# Patient Record
Sex: Female | Born: 1995 | Race: Black or African American | Hispanic: No | Marital: Single | Smoking: Never smoker
Health system: Southern US, Community
[De-identification: ages and names within clinical notes are randomized; demographics above are authoritative.]

## PROBLEM LIST (undated history)

## (undated) DIAGNOSIS — N83209 Unspecified ovarian cyst, unspecified side: Secondary | ICD-10-CM

## (undated) DIAGNOSIS — Z789 Other specified health status: Secondary | ICD-10-CM

## (undated) HISTORY — DX: Other specified health status: Z78.9

## (undated) HISTORY — DX: Unspecified ovarian cyst, unspecified side: N83.209

## (undated) HISTORY — PX: WISDOM TOOTH EXTRACTION: SHX21

## (undated) HISTORY — PX: SPINE SURGERY: SHX786

---

## 2021-02-23 ENCOUNTER — Encounter: Payer: Self-pay | Admitting: Physician Assistant

## 2021-02-23 ENCOUNTER — Emergency Department
Admission: EM | Admit: 2021-02-23 | Discharge: 2021-02-23 | Disposition: A | Discharge status: Home / Self Care | Payer: Medicaid Other | Attending: Emergency Medicine | Admitting: Emergency Medicine

## 2021-02-23 ENCOUNTER — Other Ambulatory Visit: Payer: Self-pay

## 2021-02-23 DIAGNOSIS — Z3201 Encounter for pregnancy test, result positive: Secondary | ICD-10-CM | POA: Diagnosis not present

## 2021-02-23 LAB — POC URINE PREG, ED: Preg Test, Ur: POSITIVE — AB

## 2021-02-23 MED ORDER — PRENATAL PLUS 27-1 MG PO TABS: 1.0000 | ORAL_TABLET | Freq: Every day | ORAL | 2 refills | Status: AC

## 2021-02-23 NOTE — ED Provider Notes (Addendum)
[facility] Emergency Department Provider Note ____________________________________________  Time seen: 0906  I have reviewed the triage vital signs and the nursing notes.  HISTORY  Chief Complaint  Possible Pregnancy   HPI Tiffany Booth is a 25 y.o. female patient who reports her last menstrual period was 5/18, presents to the ED for evaluation of a home pregnancy test that was positive today.  Patient presented to the ED for combination of the test.  It appears she was somewhat in denial about the possibility of being pregnant.  History reviewed. No pertinent past medical history.  There are no problems to display for this patient.   History reviewed. No pertinent surgical history.  Prior to Admission medications   Medication Sig Start Date End Date Taking? Authorizing Provider  prenatal vitamin w/FE, FA (PRENATAL 1 + 1) 27-1 MG TABS tablet Take 1 tablet by mouth daily at 12 noon. 02/23/21 05/24/21 Yes Audryna Wendt, Charlesetta Ivory, PA-C    Allergies Patient has no known allergies.  History reviewed. No pertinent family history.  Social History    Review of Systems  Constitutional: Negative for fever. Eyes: Negative for visual changes. ENT: Negative for sore throat. Cardiovascular: Negative for chest pain. Respiratory: Negative for shortness of breath. Gastrointestinal: Negative for abdominal pain, vomiting and diarrhea. Genitourinary: Negative for dysuria.  Amenorrhea as above. Musculoskeletal: Negative for back pain. Skin: Negative for rash. Neurological: Negative for headaches, focal weakness or numbness. ____________________________________________  PHYSICAL EXAM:  VITAL SIGNS: ED Triage Vitals  Enc Vitals Group     BP 02/23/21 0837 118/81     Pulse Rate 02/23/21 0837 (!) 102     Resp 02/23/21 0837 16     Temp 02/23/21 0837 98.4 F (36.9 C)     Temp Source 02/23/21 0837 Oral     SpO2 02/23/21 0837 100 %     Weight 02/23/21 0835 216  lb (98 kg)     Height 02/23/21 0835 5\' 6"  (1.676 m)     Head Circumference --      Peak Flow --      Pain Score 02/23/21 0835 0     Pain Loc --      Pain Edu? --      Excl. in GC? --     Constitutional: Alert and oriented. Well appearing and in no distress. Head: Normocephalic and atraumatic. Eyes: Conjunctivae are normal. Normal extraocular movements Cardiovascular: Normal rate, regular rhythm. Normal distal pulses. Respiratory: Normal respiratory effort.  Musculoskeletal: Nontender with normal range of motion in all extremities.  Neurologic:  Normal gait without ataxia. Normal speech and language. No gross focal neurologic deficits are appreciated. Skin:  Skin is warm, dry and intact. No rash noted. Psychiatric: Mood and affect are normal. Patient exhibits appropriate insight and judgment. ____________________________________________    LABS (pertinent positives/negatives) Labs Reviewed  POC URINE PREG, ED - Abnormal; Notable for the following components:      Result Value   Preg Test, Ur POSITIVE (*)    All other components within normal limits  ____________________________________________  EKG  ____________________________________________   RADIOLOGY Official radiology report(s): No results found. ____________________________________________  PROCEDURES   Procedures ____________________________________________   INITIAL IMPRESSION / ASSESSMENT AND PLAN / ED COURSE  As part of my medical decision making, I reviewed the following data within the electronic MEDICAL RECORD NUMBER Labs reviewed as noted and Notes from prior ED visits     Patient with ED evaluation and request for repeat pregnancy test after  she had a home positive pregnancy test.  Patient's urine pregnancy in the ED is positive, and she is tearful about the results.  She endorses that she intends to see the pregnancy through, but is disappointed because she is set to graduate in April.  Patient is advised  that her EDD is 07/09/2022.  She is approximately 11 weeks and 5 days gestation at this time.  She has no complaints at this time of any pelvic pain, bleeding, discharge.  She was referred to a local OB provider for initial prenatal care.    Tiffany Booth was evaluated in Emergency Department on 02/23/2021 for the symptoms described in the history of present illness. She was evaluated in the context of the global COVID-19 pandemic, which necessitated consideration that the patient might be at risk for infection with the SARS-CoV-2 virus that causes COVID-19. Institutional protocols and algorithms that pertain to the evaluation of patients at risk for COVID-19 are in a state of rapid change based on information released by regulatory bodies including the CDC and federal and state organizations. These policies and algorithms were followed during the patient's care in the ED.  ____________________________________________  FINAL CLINICAL IMPRESSION(S) / ED DIAGNOSES  Final diagnoses:  Pregnancy test positive      Karmen Stabs, Charlesetta Ivory, PA-C 02/23/21 1703    Lissa Hoard, PA-C 02/23/21 1707    Delton Prairie, MD 02/24/21 1517

## 2021-02-23 NOTE — ED Triage Notes (Signed)
Pt here requesting a pregnancy test to confirm a postitve pregnancy test she took at home. Pt A/Ox 4.

## 2021-02-23 NOTE — Discharge Instructions (Signed)
We have confirmed your pregnancy. You are 11 weeks and 5 days pregnant. You have an expected delivery date of September 09, 2021. Start the prenatal vitamins and establish care with the health department or a local OB provider.

## 2021-03-14 DIAGNOSIS — O99512 Diseases of the respiratory system complicating pregnancy, second trimester: Secondary | ICD-10-CM | POA: Diagnosis not present

## 2021-03-14 DIAGNOSIS — J029 Acute pharyngitis, unspecified: Secondary | ICD-10-CM | POA: Diagnosis not present

## 2021-03-14 DIAGNOSIS — Z3A16 16 weeks gestation of pregnancy: Secondary | ICD-10-CM | POA: Diagnosis not present

## 2021-03-14 DIAGNOSIS — Z3A15 15 weeks gestation of pregnancy: Secondary | ICD-10-CM | POA: Diagnosis not present

## 2021-03-14 DIAGNOSIS — Z20822 Contact with and (suspected) exposure to covid-19: Secondary | ICD-10-CM | POA: Diagnosis not present

## 2021-03-14 DIAGNOSIS — R059 Cough, unspecified: Secondary | ICD-10-CM | POA: Diagnosis not present

## 2021-03-24 DIAGNOSIS — Z419 Encounter for procedure for purposes other than remedying health state, unspecified: Secondary | ICD-10-CM | POA: Diagnosis not present

## 2021-04-04 ENCOUNTER — Ambulatory Visit (INDEPENDENT_AMBULATORY_CARE_PROVIDER_SITE_OTHER): Payer: Medicaid Other | Admitting: Advanced Practice Midwife

## 2021-04-04 ENCOUNTER — Encounter: Payer: Self-pay | Admitting: Advanced Practice Midwife

## 2021-04-04 ENCOUNTER — Other Ambulatory Visit (HOSPITAL_COMMUNITY)
Admission: RE | Admit: 2021-04-04 | Discharge: 2021-04-04 | Disposition: A | Discharge status: Home / Self Care | Payer: Medicaid Other | Source: Ambulatory Visit | Attending: Advanced Practice Midwife | Admitting: Advanced Practice Midwife

## 2021-04-04 ENCOUNTER — Other Ambulatory Visit: Payer: Self-pay

## 2021-04-04 VITALS — BP 100/60 | Wt 222.0 lb

## 2021-04-04 DIAGNOSIS — Z131 Encounter for screening for diabetes mellitus: Secondary | ICD-10-CM

## 2021-04-04 DIAGNOSIS — Z3490 Encounter for supervision of normal pregnancy, unspecified, unspecified trimester: Secondary | ICD-10-CM | POA: Insufficient documentation

## 2021-04-04 DIAGNOSIS — O99212 Obesity complicating pregnancy, second trimester: Secondary | ICD-10-CM

## 2021-04-04 DIAGNOSIS — Z369 Encounter for antenatal screening, unspecified: Secondary | ICD-10-CM

## 2021-04-04 DIAGNOSIS — Z130 Encounter for screening for diseases of the blood and blood-forming organs and certain disorders involving the immune mechanism: Secondary | ICD-10-CM

## 2021-04-04 DIAGNOSIS — Z3A13 13 weeks gestation of pregnancy: Secondary | ICD-10-CM

## 2021-04-04 DIAGNOSIS — O99210 Obesity complicating pregnancy, unspecified trimester: Secondary | ICD-10-CM | POA: Insufficient documentation

## 2021-04-04 DIAGNOSIS — Z113 Encounter for screening for infections with a predominantly sexual mode of transmission: Secondary | ICD-10-CM

## 2021-04-04 DIAGNOSIS — Z124 Encounter for screening for malignant neoplasm of cervix: Secondary | ICD-10-CM

## 2021-04-04 DIAGNOSIS — Z3402 Encounter for supervision of normal first pregnancy, second trimester: Secondary | ICD-10-CM

## 2021-04-04 DIAGNOSIS — Z1159 Encounter for screening for other viral diseases: Secondary | ICD-10-CM

## 2021-04-04 NOTE — Patient Instructions (Signed)

## 2021-04-05 ENCOUNTER — Other Ambulatory Visit: Payer: Self-pay

## 2021-04-05 ENCOUNTER — Ambulatory Visit (INDEPENDENT_AMBULATORY_CARE_PROVIDER_SITE_OTHER): Payer: Medicaid Other | Admitting: Internal Medicine

## 2021-04-05 ENCOUNTER — Encounter: Payer: Self-pay | Admitting: Internal Medicine

## 2021-04-05 VITALS — BP 105/70 | HR 83 | Temp 98.4°F | Ht 66.42 in | Wt 222.6 lb

## 2021-04-05 DIAGNOSIS — M79604 Pain in right leg: Secondary | ICD-10-CM | POA: Diagnosis not present

## 2021-04-05 DIAGNOSIS — M79661 Pain in right lower leg: Secondary | ICD-10-CM | POA: Diagnosis not present

## 2021-04-05 DIAGNOSIS — M79662 Pain in left lower leg: Secondary | ICD-10-CM | POA: Diagnosis not present

## 2021-04-05 DIAGNOSIS — M79605 Pain in left leg: Secondary | ICD-10-CM

## 2021-04-05 DIAGNOSIS — Z0000 Encounter for general adult medical examination without abnormal findings: Secondary | ICD-10-CM

## 2021-04-05 LAB — URINALYSIS, ROUTINE W REFLEX MICROSCOPIC
Bilirubin, UA: NEGATIVE
Glucose, UA: NEGATIVE
Ketones, UA: NEGATIVE
Leukocytes,UA: NEGATIVE
Nitrite, UA: NEGATIVE
Protein,UA: NEGATIVE
RBC, UA: NEGATIVE
Specific Gravity, UA: >1.03 — ABNORMAL HIGH (ref 1.005–1.030)
Urobilinogen, Ur: 0.2 mg/dL (ref 0.2–1.0)
pH, UA: 6.5 (ref 5.0–7.5)
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
## 2021-04-06 ENCOUNTER — Encounter: Payer: Self-pay | Admitting: Advanced Practice Midwife

## 2021-04-06 LAB — COMPREHENSIVE METABOLIC PANEL
ALT: 10 IU/L (ref 0–32)
AST: 13 IU/L (ref 0–40)
Albumin/Globulin Ratio: 1.7 (ref 1.2–2.2)
Albumin: 4.2 g/dL (ref 3.9–5.0)
Alkaline Phosphatase: 64 IU/L (ref 44–121)
BUN/Creatinine Ratio: 9 (ref 9–23)
BUN: 5 mg/dL — ABNORMAL LOW (ref 6–20)
Bilirubin Total: 0.3 mg/dL (ref 0.0–1.2)
CO2: 21 mmol/L (ref 20–29)
Calcium: 9.5 mg/dL (ref 8.7–10.2)
Chloride: 102 mmol/L (ref 96–106)
Creatinine, Ser: 0.58 mg/dL (ref 0.57–1.00)
Globulin, Total: 2.5 g/dL (ref 1.5–4.5)
Glucose: 71 mg/dL (ref 65–99)
Potassium: 4 mmol/L (ref 3.5–5.2)
Sodium: 136 mmol/L (ref 134–144)
Total Protein: 6.7 g/dL (ref 6.0–8.5)
eGFR: 131 mL/min/{1.73_m2} (ref >59)

## 2021-04-06 LAB — CBC WITH DIFFERENTIAL/PLATELET
Basophils Absolute: 0 x10E3/uL (ref 0.0–0.2)
Basos: 1 %
EOS (ABSOLUTE): 0.2 x10E3/uL (ref 0.0–0.4)
Eos: 2 %
Hematocrit: 36.4 % (ref 34.0–46.6)
Hemoglobin: 12 g/dL (ref 11.1–15.9)
Immature Grans (Abs): 0 x10E3/uL (ref 0.0–0.1)
Immature Granulocytes: 0 %
Lymphocytes Absolute: 1.9 x10E3/uL (ref 0.7–3.1)
Lymphs: 23 %
MCH: 27.5 pg (ref 26.6–33.0)
MCHC: 33 g/dL (ref 31.5–35.7)
MCV: 83 fL (ref 79–97)
Monocytes Absolute: 0.7 x10E3/uL (ref 0.1–0.9)
Monocytes: 8 %
Neutrophils Absolute: 5.7 x10E3/uL (ref 1.4–7.0)
Neutrophils: 66 %
Platelets: 251 x10E3/uL (ref 150–450)
RBC: 4.37 x10E6/uL (ref 3.77–5.28)
RDW: 13 % (ref 11.7–15.4)
WBC: 8.6 x10E3/uL (ref 3.4–10.8)

## 2021-04-06 LAB — RPR+RH+ABO+RUB AB+AB SCR+CB...
Antibody Screen: NEGATIVE
HIV Screen 4th Generation wRfx: NONREACTIVE
Hematocrit: 36.3 % (ref 34.0–46.6)
Hemoglobin: 12.1 g/dL (ref 11.1–15.9)
Hepatitis B Surface Ag: NEGATIVE
MCH: 27.6 pg (ref 26.6–33.0)
MCHC: 33.3 g/dL (ref 31.5–35.7)
MCV: 83 fL (ref 79–97)
Platelets: 245 x10E3/uL (ref 150–450)
RBC: 4.38 x10E6/uL (ref 3.77–5.28)
RDW: 13.1 % (ref 11.7–15.4)
RPR Ser Ql: NONREACTIVE
Rh Factor: POSITIVE
Rubella Antibodies, IGG: 6.39 {index}
Varicella zoster IgG: 1564 {index}
WBC: 8.7 x10E3/uL (ref 3.4–10.8)

## 2021-04-06 LAB — HGB FRACTIONATION CASCADE
Hgb A2: 3.3 % — ABNORMAL HIGH (ref 1.8–3.2)
Hgb A: 96.7 % (ref 96.4–98.8)
Hgb F: 0 % (ref 0.0–2.0)
Hgb S: 0 %

## 2021-04-06 LAB — LIPID PANEL
Chol/HDL Ratio: 2 ratio (ref 0.0–4.4)
Cholesterol, Total: 188 mg/dL (ref 100–199)
HDL: 96 mg/dL (ref >39)
LDL Chol Calc (NIH): 77 mg/dL (ref 0–99)
Triglycerides: 82 mg/dL (ref 0–149)
VLDL Cholesterol Cal: 15 mg/dL (ref 5–40)

## 2021-04-06 LAB — TSH: TSH: 1.49 u[IU]/mL (ref 0.450–4.500)

## 2021-04-06 LAB — HGB A1C W/O EAG: Hgb A1c MFr Bld: 5.1 % (ref 4.8–5.6)

## 2021-04-06 LAB — HEPATITIS C ANTIBODY: Hep C Virus Ab: <0.1 {s_co_ratio} (ref 0.0–0.9)

## 2021-04-06 NOTE — Progress Notes (Signed)
New Obstetric Patient H&P  Date of Service: 04/04/2021  Chief Complaint: "Desires prenatal care"   History of Present Illness: Patient is a 26 y.o. G1P0 Not Hispanic or Latino female, presents with amenorrhea and positive home pregnancy test. Patient's last menstrual period was 12/03/2020 (exact date). and based on her  LMP, her EDD is Estimated Date of Delivery: 09/09/21 and her EGA is [redacted]w[redacted]d. Cycles are 5 days, regular, and occur approximately every : 28 days. She is possibly only 13 weeks based on fetal heart tones location by doppler. Her first PAP smear is today.    She had a urine pregnancy test which was positive 5 week(s)  ago. Her last menstrual period was normal and lasted for  5 day(s). Since her LMP she claims she has experienced breast tenderness, fatigue, nausea, vomiting. She denies vaginal bleeding. Her past medical history is noncontributory.   Since her LMP, she admits to the use of tobacco products: she quit vaping She claims she has gained  6  pounds since the start of her pregnancy.  There are cats in the home in the home  no  She admits close contact with children on a regular basis  no  She has had chicken pox in the past no She has had Tuberculosis exposures, symptoms, or previously tested positive for TB   no Current or past history of domestic violence. no  Genetic Screening/Teratology Counseling: (Includes patient, baby's father, or anyone in either family with:)   1. Patient's age >/= 61 at [facility]  no 2. Thalassemia ([place], [place], Mediterranean, or Asian background): MCV<80  no 3. Neural tube defect (meningomyelocele, spina bifida, anencephaly)  no 4. Congenital heart defect  no  5. Down syndrome  Her uncle has DS 6. Tay-Sachs (Jewish, [place]  no 7. Canavan's Disease  no 8. Sickle cell disease or trait (African)  no  9. Hemophilia or other blood disorders  no  10. Muscular dystrophy  no  11. Cystic fibrosis  no  12. Huntington's Chorea  no  13.  Mental retardation/autism  Her brother has MR 14. Other inherited genetic or chromosomal disorder  no 15. Maternal metabolic disorder (DM, PKU, etc)  no 16. Patient or FOB with a child with a birth defect not listed above no  16a. Patient or FOB with a birth defect themselves no 17. Recurrent pregnancy loss, or stillbirth  no  18. Any medications since LMP other than prenatal vitamins (include vitamins, supplements, OTC meds, drugs, alcohol)  no 19. 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 She is encouraged to use protection and avoid exposure as much as possible.  Infection History:   1. Lives with someone with TB or TB exposed  no  2. Patient or partner has history of genital herpes  no 3. Rash or viral illness since LMP  no 4. History of STI (GC, CT, HPV, syphilis, HIV)  no 5. History of recent travel :  no  Other pertinent information:  no     Review of Systems:10 point review of systems negative unless otherwise noted in HPI  Past Medical History:  Patient Active Problem List   Diagnosis Date Noted   Supervision of normal pregnancy 04/04/2021     Nursing Staff Provider  Office Location  [facility] Dating    Language  English Anatomy US    Flu Vaccine   Genetic Screen  NIPS:   TDaP vaccine  Hgb A1C or  GTT Early : Third trimester :   Covid    LAB RESULTS   Rhogam   Blood Type B/Positive/-- (09/12 1109)   Feeding Plan Breast Antibody Negative (09/12 1109)  Contraception  Rubella 6.39 (09/12 1109)  Circumcision  RPR Non Reactive (09/12 1109)   Pediatrician   HBsAg Negative (09/12 1109)   Support Person Partner Tiffany Booth Her mom Tiffany Booth HIV Non Reactive (09/12 1109)  Prenatal Classes  Varicella     GBS  (For PCN allergy, check sensitivities)   BTL Consent     VBAC Consent NA Pap  1st PAP @ [facility] 04/04/21    Hgb Electro    Pelvis Tested NA CF      SMA            Obesity affecting pregnancy  04/04/2021    Past Surgical History:  Past Surgical History:  Procedure Laterality Date   SPINE SURGERY      Gynecologic History: Patient's last menstrual period was 12/03/2020 (exact date).  Obstetric History: G1P0  Family History:  Family History  Problem Relation Age of Onset   Hypertension Mother    Breast cancer Maternal Grandmother 50    Social History:  Social History   Socioeconomic History   Marital status: Single    Spouse name: Not on file   Number of children: Not on file   Years of education: Not on file   Highest education level: Not on file  Occupational History   Not on file  Tobacco Use   Smoking status: Never   Smokeless tobacco: Never  Vaping Use   Vaping Use: Former  Substance and Sexual Activity   Alcohol use: Never   Drug use: Never   Sexual activity: Yes  Other Topics Concern   Not on file  Social History Narrative   Not on file   Social Determinants of Health   Financial Resource Strain: Not on file  Food Insecurity: Not on file  Transportation Needs: Not on file  Physical Activity: Not on file  Stress: Not on file  Social Connections: Not on file  Intimate Partner Violence: Not on file    Allergies:  No Known Allergies  Medications: Prior to Admission medications   Medication Sig Start Date End Date Taking? Authorizing Provider  prenatal vitamin w/FE, FA (PRENATAL 1 + 1) 27-1 MG TABS tablet Take 1 tablet by mouth daily at 12 noon. 02/23/21 05/24/21 Yes Tiffany Booth, Tiffany Ivory, PA-C    Physical Exam Vitals: Blood pressure 100/60, weight 222 lb (100.7 kg), last menstrual period 12/03/2020.  General: NAD HEENT: normocephalic, anicteric Thyroid: no enlargement, no palpable nodules Pulmonary: No increased work of breathing, CTAB Cardiovascular: RRR, distal pulses 2+ Abdomen: NABS, soft, non-tender, non-distended.  Umbilicus without lesions.  No hepatomegaly, splenomegaly or masses palpable. No evidence of hernia. FHTs by  doppler 160s at approximately 13w gestation location  Genitourinary:  External: Normal external female genitalia.  Normal urethral meatus, normal  Bartholin's and Skene's glands.    Vagina: Normal vaginal mucosa, no evidence of prolapse.    Cervix: Grossly normal in appearance, no bleeding, no CMT  Uterus:  Non-enlarged, mobile, normal contour.    Adnexa: ovaries non-enlarged, no adnexal masses  Rectal: deferred Extremities: no edema, erythema, or tenderness Neurologic: Grossly intact Psychiatric: mood appropriate, affect full   The following were addressed during this visit:  Breastfeeding Education - Early initiation of breastfeeding    Comments: Keeps milk supply adequate, helps contract uterus and  slow bleeding, and early milk is the perfect first food and is easy to digest.   - The importance of exclusive breastfeeding    Comments: Provides antibodies, Lower risk of breast and ovarian cancers, and type-2 diabetes,Helps your body recover, Reduced chance of SIDS.   - Risks of giving your baby anything other than breast milk if you are breastfeeding    Comments: Make the baby less content with breastfeeds, may make my baby more susceptible to illness, and may reduce my milk supply.   - The importance of early skin-to-skin contact    Comments:  Keeps baby warm and secure, helps keep baby's blood sugar up and breathing steady, easier to bond and breastfeed, and helps calm baby.  - Rooming-in on a 24-hour basis    Comments: Easier to learn baby's feeding cues, easier to bond and get to know each other, and encourages milk production.   - Feeding on demand or baby-led feeding    Comments: Helps prevent breastfeeding complications, helps bring in good milk supply, prevents under or overfeeding, and helps baby feel content and satisfied   - Frequent feeding to help assure optimal milk production    Comments: Making a full supply of milk requires frequent removal of milk from  breasts, infant will eat 8-12 times in 24 hours, if separated from infant use breast massage, hand expression and/ or pumping to remove milk from breasts.   - Effective positioning and attachment    Comments: Helps my baby to get enough breast milk, helps to produce an adequate milk supply, and helps prevent nipple pain and damage   - Exclusive breastfeeding for the first 6 months    Comments: Builds a healthy milk supply and keeps it up, protects baby from sickness and disease, and breastmilk has everything your baby needs for the first 6 months.   Assessment: 25 y.o. G1P0 at [redacted]w[redacted]d by LMP presenting to initiate prenatal care  Plan: 1) Avoid alcoholic beverages. 2) Patient encouraged not to smoke.  3) Discontinue the use of all non-medicinal drugs and chemicals.  4) Take prenatal vitamins daily.  5) Nutrition, food safety (fish, cheese advisories, and high nitrite foods) and exercise discussed. 6) Hospital and practice style discussed with cross coverage system.  7) Genetic Screening, such as with 1st Trimester Screening, cell free fetal DNA, AFP testing, and Ultrasound, as well as with amniocentesis and CVS as appropriate, is discussed with patient. At the conclusion of today's visit patient requested genetic testing 8) Patient is asked about travel to areas at risk for the Zika virus, and counseled to avoid travel and exposure to mosquitoes or sexual partners who may have themselves been exposed to the virus. Testing is discussed, and will be ordered as appropriate. 9) PAPtima, urine culture, [facility] panel, sickle cell screen, Hep C screen, Hgb A1C, MaterniT 21 today 10) Return to clinic in 1 week for dating scan and ROB   Tresea Mall, CNM [facility] 04/06/2021, 12:56 PM

## 2021-04-07 LAB — URINE CULTURE: Organism ID, Bacteria: NO GROWTH

## 2021-04-07 LAB — CYTOLOGY - PAP: Diagnosis: NEGATIVE

## 2021-04-09 LAB — MATERNIT21 PLUS CORE+SCA
Fetal Fraction: 6
Monosomy X (Turner Syndrome): NOT DETECTED
Result (T21): NEGATIVE
Trisomy 13 (Patau syndrome): NEGATIVE
Trisomy 18 (Edwards syndrome): NEGATIVE
Trisomy 21 (Down syndrome): NEGATIVE
XXX (Triple X Syndrome): NOT DETECTED
XXY (Klinefelter Syndrome): NOT DETECTED
XYY (Jacobs Syndrome): NOT DETECTED

## 2021-04-12 ENCOUNTER — Other Ambulatory Visit: Payer: Self-pay

## 2021-04-12 ENCOUNTER — Ambulatory Visit (INDEPENDENT_AMBULATORY_CARE_PROVIDER_SITE_OTHER): Payer: Medicaid Other | Admitting: Obstetrics & Gynecology

## 2021-04-12 VITALS — BP 120/80 | Wt 223.0 lb

## 2021-04-12 DIAGNOSIS — Z369 Encounter for antenatal screening, unspecified: Secondary | ICD-10-CM

## 2021-04-12 DIAGNOSIS — Z3402 Encounter for supervision of normal first pregnancy, second trimester: Secondary | ICD-10-CM

## 2021-04-12 DIAGNOSIS — N926 Irregular menstruation, unspecified: Secondary | ICD-10-CM | POA: Diagnosis not present

## 2021-04-12 DIAGNOSIS — Z3689 Encounter for other specified antenatal screening: Secondary | ICD-10-CM

## 2021-04-12 NOTE — Progress Notes (Signed)
  Subjective  No pain or nausea  Objective  BP 120/80   Wt 223 lb (101.2 kg)   LMP 12/03/2020 (Exact Date)   BMI 35.54 kg/m  General: NAD Pumonary: no increased work of breathing Abdomen: gravid, non-tender Extremities: no edema Psychiatric: mood appropriate, affect full  Assessment  25 y.o. G1P0 at [redacted]w[redacted]d by  09/09/2021, by Last Menstrual Period presenting for routine prenatal visit  Plan   Problem List Items Addressed This Visit   None Visit Diagnoses    Irregular menstrual cycle    -  Primary    PNV [place] today, see report Will sch Anat scan Labs UTD NIPT discussed  Pregnancy #1 Problems (from 04/04/21 to present)    Problem Noted Resolved   Supervision of normal pregnancy 04/04/2021 by Tresea Mall, CNM No   Overview Addendum 04/06/2021  1:07 PM by Tresea Mall, CNM     Nursing Staff Provider  Office Location  [facility] Dating  LMP and [place] (18 wks)  Language  English Anatomy US    Flu Vaccine   Genetic Screen  NIPS: nml XY  TDaP vaccine    Hgb A1C or  GTT Third trimester :   Covid    LAB RESULTS   Rhogam   Blood Type B/Positive/-- (09/12 1109)   Feeding Plan Breast Antibody Negative (09/12 1109)  Contraception  Rubella 6.39 (09/12 1109)  Circumcision  RPR Non Reactive (09/12 1109)   Pediatrician   HBsAg Negative (09/12 1109)   Support Person Partner [name] Her mom Vickie HIV Non Reactive (09/12 1109)  Prenatal Classes  Varicella     GBS  (For PCN allergy, check sensitivities)   BTL Consent     VBAC Consent NA Pap  1st PAP @ [facility] 04/04/21    Hgb Electro    Pelvis Tested NA CF      SMA               Obesity affecting pregnancy 04/04/2021 by Tresea Mall, CNM No       Annamarie Major, MD, [facility], [facility] 04/12/2021  2:21 PM

## 2021-04-12 NOTE — Patient Instructions (Signed)

## 2021-04-12 NOTE — Progress Notes (Signed)
ULTRASOUND REPORT  Location: [facility] Date of Service: 04/12/2021   Indications: Irregular periods, unclear LMP Findings:  Mason Jim intrauterine pregnancy is visualized with FHR at 150 BPM.   Biometrics give an (U/S) Gestational age of [redacted]w[redacted]d and an (U/S) EDD of 09/09/21; this correlates with the clinically established Estimated Date of Delivery: 09/09/21   Fetal presentation is Variable. Good fetal movements Placenta: posterior. Grade: 1 AFI: subjectively normal.   Right Ovary: is normal in appearance. Left Ovary: is normal appearance. Survey of the adnexa demonstrates no adnexal masses.  There is no free peritoneal fluid in the cul de sac.  Impression: 1. [redacted]w[redacted]d Viable Singleton Intrauterine pregnancy by U/S. 2. (U/S) EDD is consistent with Clinically established Estimated Date of Delivery: 09/09/21 .  Recommendations: 1.Clinical correlation with the patient's History and Physical Exam. 2. EDC will be 09/09/2021  Letitia Libra, MD

## 2021-04-18 ENCOUNTER — Telehealth: Payer: Self-pay

## 2021-04-18 ENCOUNTER — Other Ambulatory Visit: Payer: Self-pay

## 2021-04-18 ENCOUNTER — Ambulatory Visit
Admission: RE | Admit: 2021-04-18 | Discharge: 2021-04-18 | Disposition: A | Discharge status: Home / Self Care | Payer: Medicaid Other | Source: Ambulatory Visit | Attending: Obstetrics & Gynecology | Admitting: Obstetrics & Gynecology

## 2021-04-18 DIAGNOSIS — Z3689 Encounter for other specified antenatal screening: Secondary | ICD-10-CM | POA: Diagnosis not present

## 2021-04-18 DIAGNOSIS — Z3492 Encounter for supervision of normal pregnancy, unspecified, second trimester: Secondary | ICD-10-CM | POA: Diagnosis not present

## 2021-04-18 DIAGNOSIS — Z3A19 19 weeks gestation of pregnancy: Secondary | ICD-10-CM | POA: Diagnosis not present

## 2021-04-18 NOTE — Telephone Encounter (Signed)
Pt calling for nausea medication.  [phone]  Adv pt to try to keep something on her stomach if only a saltine cracker, pretzels, bread, dry toast; vitamin B6 10-24mg  q8hrs; unisom 25mg  at HS, 12.5mg  in am; nausea suckers, wrist bands, ginger drops, sip ginger ale.  If goes 24hrs without keeping liquids down she needs to be seen.

## 2021-04-19 DIAGNOSIS — M5430 Sciatica, unspecified side: Secondary | ICD-10-CM | POA: Diagnosis not present

## 2021-04-20 ENCOUNTER — Ambulatory Visit (INDEPENDENT_AMBULATORY_CARE_PROVIDER_SITE_OTHER): Payer: Medicaid Other | Admitting: Advanced Practice Midwife

## 2021-04-20 ENCOUNTER — Other Ambulatory Visit: Payer: Self-pay

## 2021-04-20 ENCOUNTER — Encounter: Payer: Self-pay | Admitting: Advanced Practice Midwife

## 2021-04-20 ENCOUNTER — Other Ambulatory Visit: Payer: Self-pay | Admitting: Advanced Practice Midwife

## 2021-04-20 VITALS — BP 120/80 | Wt 224.0 lb

## 2021-04-20 DIAGNOSIS — O219 Vomiting of pregnancy, unspecified: Secondary | ICD-10-CM

## 2021-04-20 DIAGNOSIS — Z3402 Encounter for supervision of normal first pregnancy, second trimester: Secondary | ICD-10-CM

## 2021-04-20 DIAGNOSIS — Z369 Encounter for antenatal screening, unspecified: Secondary | ICD-10-CM

## 2021-04-20 DIAGNOSIS — Z3A19 19 weeks gestation of pregnancy: Secondary | ICD-10-CM

## 2021-04-20 MED ORDER — ONDANSETRON 4 MG PO TBDP: 4.0000 mg | ORAL_TABLET | Freq: Three times a day (TID) | ORAL | 2 refills | Status: DC | PRN

## 2021-04-20 NOTE — Progress Notes (Signed)
Routine Prenatal Care Visit  Subjective  Tiffany Booth is a 25 y.o. G1P0 at [redacted]w[redacted]d being seen today for ongoing prenatal care.  She is currently monitored for the following issues for this low-risk pregnancy and has Supervision of normal pregnancy and Obesity affecting pregnancy on their problem list.  ----------------------------------------------------------------------------------- Patient reports nausea. Her nausea returned in the past week. She is requesting medication to treat.   . Vag. Bleeding: None.  Movement: Absent. Leaking Fluid denies.  ----------------------------------------------------------------------------------- The following portions of the patient's history were reviewed and updated as appropriate: allergies, current medications, past family history, past medical history, past social history, past surgical history and problem list. Problem list updated.  Objective  Blood pressure 120/80, weight 224 lb (101.6 kg), last menstrual period 12/03/2020. Pregravid weight 216 lb (98 kg) Total Weight Gain 8 lb (3.629 kg) Urinalysis: Urine Protein    Urine Glucose    Fetal Status: Fetal Heart Rate (bpm): 142 Fundal Height: 20 cm Movement: Absent      Anatomy scan: Incomplete for cardiac views, echogenic focus seen left ventricle, RVOT, LVOT, bilateral renal pyelectasis seen- 5 mm left, 6 mm right, otherwise complete and normal, placenta posterior. Recommendation from U/S MD for follow up with MFM.  General:  Alert, oriented and cooperative. Patient is in no acute distress.  Skin: Skin is warm and dry. No rash noted.   Cardiovascular: Normal heart rate noted  Respiratory: Normal respiratory effort, no problems with respiration noted  Abdomen: Soft, gravid, appropriate for gestational age. Pain/Pressure: Absent     Pelvic:  Cervical exam deferred        Extremities: Normal range of motion.     Mental Status: Normal mood and affect. Normal behavior. Normal judgment and thought  content.   Assessment   25 y.o. G1P0 at [redacted]w[redacted]d by  09/09/2021, by Last Menstrual Period presenting for routine prenatal visit  Plan   Pregnancy #1 Problems (from 04/04/21 to present)    Problem Noted Resolved   Supervision of normal pregnancy 04/04/2021 by Tresea Mall, CNM No   Overview Addendum 04/12/2021  2:23 PM by Nadara Mustard, MD     Nursing Staff Provider  Office Location  [facility] Dating  LMP, confirmed by 18wk [place]  Language  English Anatomy US    Flu Vaccine   Genetic Screen  NIPS: nml XY  TDaP vaccine    Hgb A1C or  GTT Third trimester :   Covid    LAB RESULTS   Rhogam   Blood Type B/Positive/-- (09/12 1109)   Feeding Plan Breast Antibody Negative (09/12 1109)  Contraception  Rubella 6.39 (09/12 1109)  Circumcision  RPR Non Reactive (09/12 1109)   Pediatrician   HBsAg Negative (09/12 1109)   Support Person Partner [name] Her mom Vickie HIV Non Reactive (09/12 1109)  Prenatal Classes  Varicella Immune    GBS  (For PCN allergy, check sensitivities)   BTL Consent no    VBAC Consent NA Pap  1st PAP @ NOB 04/04/21    Hgb Electro    Pelvis Tested NA CF      SMA               Obesity affecting pregnancy 04/04/2021 by Tresea Mall, CNM No       Preterm labor symptoms and general obstetric precautions including but not limited to vaginal bleeding, contractions, leaking of fluid and fetal movement were reviewed in detail with the patient.   Return in about 4 weeks (around 05/18/2021)  for MFM follow up scan and rob after.  Tresea Mall, CNM 04/20/2021 12:14 PM

## 2021-04-23 DIAGNOSIS — Z419 Encounter for procedure for purposes other than remedying health state, unspecified: Secondary | ICD-10-CM | POA: Diagnosis not present

## 2021-04-25 ENCOUNTER — Telehealth: Payer: Self-pay

## 2021-04-25 NOTE — Telephone Encounter (Signed)
Pt calling; is 20wks; constipated; can a rx be called in to help her go to the BR.  [phone]  Adv fiber supplements - fibercon, metamucil, citracel; bran cereal, leafy greens, fresh fruits and vegetables; colace - will work best if she is eating her fresh fruits and vegetables.  Pt states 'so you can't get someone to rx a pill for me to go to the BR?'  Adv we want her to try these measures first.

## 2021-05-10 ENCOUNTER — Encounter: Payer: Medicaid Other | Admitting: Obstetrics and Gynecology

## 2021-05-11 ENCOUNTER — Ambulatory Visit: Payer: Medicaid Other | Admitting: *Deleted

## 2021-05-11 ENCOUNTER — Ambulatory Visit: Payer: Medicaid Other | Attending: Advanced Practice Midwife

## 2021-05-11 ENCOUNTER — Encounter: Payer: Self-pay | Admitting: *Deleted

## 2021-05-11 ENCOUNTER — Ambulatory Visit (HOSPITAL_BASED_OUTPATIENT_CLINIC_OR_DEPARTMENT_OTHER): Payer: Medicaid Other

## 2021-05-11 ENCOUNTER — Other Ambulatory Visit: Payer: Self-pay

## 2021-05-11 VITALS — BP 107/62 | HR 97

## 2021-05-11 DIAGNOSIS — Z369 Encounter for antenatal screening, unspecified: Secondary | ICD-10-CM | POA: Insufficient documentation

## 2021-05-11 DIAGNOSIS — Z3A22 22 weeks gestation of pregnancy: Secondary | ICD-10-CM | POA: Diagnosis not present

## 2021-05-11 DIAGNOSIS — O359XX0 Maternal care for (suspected) fetal abnormality and damage, unspecified, not applicable or unspecified: Secondary | ICD-10-CM

## 2021-05-11 DIAGNOSIS — O99212 Obesity complicating pregnancy, second trimester: Secondary | ICD-10-CM

## 2021-05-11 DIAGNOSIS — Z315 Encounter for genetic counseling: Secondary | ICD-10-CM | POA: Insufficient documentation

## 2021-05-11 DIAGNOSIS — Z6834 Body mass index (BMI) 34.0-34.9, adult: Secondary | ICD-10-CM | POA: Diagnosis not present

## 2021-05-11 DIAGNOSIS — Z3689 Encounter for other specified antenatal screening: Secondary | ICD-10-CM | POA: Diagnosis not present

## 2021-05-11 DIAGNOSIS — Z3402 Encounter for supervision of normal first pregnancy, second trimester: Secondary | ICD-10-CM | POA: Diagnosis not present

## 2021-05-11 NOTE — Progress Notes (Signed)
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   EDC: 09/09/2021 LMP: 12/03/2020 Ordering Provider: Tresea Booth, CNM  EGA: [redacted]w[redacted]d Genetic Counselor: Tiffany Dunk, MS, CGC  OB Hx: G1P0 Date of Appointment: 05/11/2021  Accompanied by: Her mother, Tiffany Booth Face to Face Time: 30 Minutes   Previous Testing Completed: Tiffany Booth previously completed Non-Invasive Prenatal Screening (NIPS) in this pregnancy (scanned into Epic under the Media tab). The result is low risk. This screening significantly reduces the risk that the current pregnancy has Down syndrome, Trisomy 4, Trisomy 13, Monosomy X, and Triploidy, however, the risk is not zero given the limitations of NIPS. Additionally, there are many genetic conditions that cannot be detected by NIPS.  Tiffany Booth completed a hemoglobin electrophoresis in this pregnancy. The result shows borderline increased Hgb A2 (3.3%). 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 See genetic cousneling portion of note below.    Medical History:  Reports she takes prenatal vitamins and tylenol. Denies personal history of diabetes, high blood pressure, thyroid conditions, and seizures. Denies bleeding, infections, and fevers in this pregnancy. Denies using tobacco, alcohol, or street drugs in this pregnancy.   Family History: A pedigree was created and scanned into Epic under the Media tab. Tiffany Booth reports a deceased maternal uncle who had Down syndrome. No medical records are available for review for this individual. Tiffany Booth reports her maternal half brother was born at [redacted] weeks gestation. She reports he has intellectual disability that is thought to be due to complications from his extreme prematurity. Maternal ethnicity reported as Tiffany Booth centre manager and paternal ethnicity reported as Tiffany Booth centre manager. Denies Ashkenazi Jewish ancestry. Family  history not remarkable for consanguinity, individuals with birth defects, autism spectrum disorder, mental illness, multiple spontaneous abortions, still births, or unexplained neonatal death.     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 Tiffany Booth completed a hemoglobin electrophoresis in this pregnancy. The result shows borderline increased Hgb A2 (3.3%). 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 We reviewed that carrier screening of the HBB gene can help to further clarify her hemoglobin electrophoresis result and carrier status.   ? Hemoglobinopathies that are due to reduced or absent production of hemoglobin are known as thalassemias. If Tiffany Booth is confirmed to have Beta Thalassemia trait, and if her reproductive partner is found to also have ? Thalassemia trait, there would be a 25% risk to have offspring affected with ? Thalassemia (recessive inheritance). The two types of ? Thalassemia include ? Thalassemia Major (Cooley's Anemia) and ? Thalassemia Intermedia. Symptoms of ? Thalassemia Major appear within the first 2 years of life. Affected individuals have life-threatening anemia, failure to thrive, jaundice, splenic enlargement, etc. Many people with ? Thalassemia Major have such severe symptoms that they need frequent blood transfusions, however, over time, an influx of iron-containing hemoglobin from chronic blood transfusions can lead to a buildup of iron in the body, resulting in liver, heart, and hormone problems. Symptoms of ? Thalassemia Intermedia appear in early childhood or later in life. Affected individuals have mild to moderate anemia and may also have slow growth and bone abnormalities.  If Tiffany Booth is confirmed to have Beta Thalassemia trait, and if her reproductive partner is found to be a carrier for a ? Hemoglobinopathy that affects the structure of hemoglobin (i.e., Hemoglobin S trait, C trait, E trait, etc.) there would be a 25% risk for  their offspring together to be affected  by a number of other types of Thalassemia including the following: Individuals affected with Sickle ?+Thalassemia (Hb S/?+) may have milder symptoms of Sickle Cell Disease (Hb S/S) while individuals with Sickle ? Thalassemia (Hb S/?) typically have symptoms identical to Sickle Cell Disease. Individuals affected with Hemoglobin C ? Thalassemia (Hb C/?+ or Hb C/?) typically have moderate to severe anemia and splenic enlargement. Individuals affected with Hemoglobin E ? Thalassemia (Hb E/?+ or Hb E/?) have symptoms that may present as a mild and asymptomatic anemia, or as a life-threatening disorder requiring transfusions from infancy.  Given Tiffany Booth's hemoglobin electrophoresis result, genetic counseling recommended carrier screening of her HBB gene to help clarify her carrier status. Genetic counseling reviewed with Tiffany Booth that carrier screening for her reproductive partner is indicated if she is confirmed to be a carrier for a ? Hemoglobinopathy. Genetic counseling reviewed with Tiffany Booth that ? Hemoglobinopathies are included in Tiffany Booth 's newborn screening program.  Soft markers were visualized on ultrasound today:  Echogenic Intracardiac Focus (EIF). EIF represents mineralization deposits of calcium in the myocardium, typically in the left ventricle, however, can also occur in the right ventricle or bilaterally. EIF is an isolated finding in anywhere from 3-20% of pregnancies and does not indicate underlying structural abnormalities or disrupt the function of the heart. The frequency of EIF has been reported as 3.5-10.5% in Caucasian patients, 10.3-30.4% in Asian patients, and 5.5-5.9% in African American patients. Genetic counseling reviewed with Tiffany Booth that an EIF is a soft marker for Down syndrome.  Tiffany Booth. Tiffany Booth refers to the finding of an increased anterior-posterior diameter of the fetal renal pelvis. Tiffany Booth is a relatively common  finding on a second-trimester anatomic survey, however, it is also a soft marker for Down syndrome.  Tiffany Booth previously completed NIPS in this pregnancy and the result is low risk. This screening significantly reduces the risk that the current pregnancy has Down syndrome, Trisomy 52, Trisomy 13, Monosomy X, and Triploidy, however, the risk is not zero given the limitations of NIPS. Michaline declined amniocentesis for prenatal diagnosis at this time.  Distant Family History of Down syndrome. Robie reports a family history of Down syndrome in a deceased maternal uncle. No medical records are available for review for this individual. Most cases of Down syndrome are isolated and not inherited. A small proportion of cases are inherited from parents carrying balanced translocations. Without knowing the karyotype results or exactly what type of Down syndrome the affected relative had, an unbalanced rearrangement causing Down syndrome cannot be completely ruled out.  Birth Defects. All babies have approximately a 3-5% risk for a birth defect and a majority of these defects cannot be detected through the screening or diagnostic testing listed below. Ultrasound may detect some birth defects, but it may not detect all birth defects. About half of pregnancies with Down syndrome do not show any soft markers on ultrasound. A normal ultrasound does not guarantee a healthy pregnancy.   Testing/Screening Options:   Amniocentesis. This procedure is available for prenatal diagnosis. Possible procedural difficulties and complications that can arise include maternal infection, cramping, bleeding, fluid leakage, and/or pregnancy loss. The risk for pregnancy loss with an amniocentesis is 1/500. Per the [employer] of Obstetricians and Gynecologists (ACOG) Practice Bulletin 162, all pregnant women should be offered prenatal assessment for aneuploidy by diagnostic testing regardless of maternal age or other risk factors. If  indicated, genetic testing that could be ordered on an amniocentesis sample includes a fetal karyotype, fetal microarray, and testing for specific syndromes.  Carrier screening. Per the ACOG Committee Opinion 691, all women who are considering a pregnancy or are currently pregnant should be offered carrier screening for, at minimum, Cystic Fibrosis (CF), Spinal Muscular Atrophy (SMA), and Hemoglobinopathies The mode of inheritance, clinical manifestations of these conditions, as well as details about testing were reviewed. A negative result on carrier screening reduces the likelihood of being a carrier, however, does not entirely rule out the possibility. If Denetra was found to be a carrier for a specific condition, carrier screening for their reproductive partner would be recommended.    Patient Plan:  Proceed with: Carrier screening for Beta Hemoglobinopathies, Alpha Thalassemia, Cystic Fibrosis, and Spinal Muscular Atrophy  Informed consent was obtained. All questions were answered.  Declined: Amniocentesis for prenatal diagnosis   Thank you for sharing in the care of Virgie with [place].  Please do not hesitate to contact us if you have any questions.  Tiffany Dunk, MS, [facility]

## 2021-05-12 ENCOUNTER — Other Ambulatory Visit: Payer: Self-pay | Admitting: *Deleted

## 2021-05-12 DIAGNOSIS — Z362 Encounter for other antenatal screening follow-up: Secondary | ICD-10-CM

## 2021-05-12 DIAGNOSIS — O35EXX0 Maternal care for other (suspected) fetal abnormality and damage, fetal genitourinary anomalies, not applicable or unspecified: Secondary | ICD-10-CM

## 2021-05-17 ENCOUNTER — Telehealth: Payer: Self-pay

## 2021-05-17 NOTE — Telephone Encounter (Signed)
FMLA/DISABILITY form for [employer] in [place] filled out.  Will get signature and process.

## 2021-05-17 NOTE — Telephone Encounter (Signed)
In addition, pt had filled out our form stating her first day out is 04/04/21 - date of her NOB appt.  Her form was not filled out to reflect this d/t we haven't taken her out of work/school.

## 2021-05-18 ENCOUNTER — Encounter: Payer: Self-pay | Admitting: Obstetrics and Gynecology

## 2021-05-18 ENCOUNTER — Other Ambulatory Visit: Payer: Medicaid Other

## 2021-05-18 ENCOUNTER — Ambulatory Visit (INDEPENDENT_AMBULATORY_CARE_PROVIDER_SITE_OTHER): Payer: Medicaid Other | Admitting: Obstetrics and Gynecology

## 2021-05-18 ENCOUNTER — Other Ambulatory Visit: Payer: Self-pay

## 2021-05-18 VITALS — BP 118/78 | Wt 235.0 lb

## 2021-05-18 DIAGNOSIS — O99212 Obesity complicating pregnancy, second trimester: Secondary | ICD-10-CM

## 2021-05-18 DIAGNOSIS — O35EXX0 Maternal care for other (suspected) fetal abnormality and damage, fetal genitourinary anomalies, not applicable or unspecified: Secondary | ICD-10-CM

## 2021-05-18 DIAGNOSIS — Z3A23 23 weeks gestation of pregnancy: Secondary | ICD-10-CM | POA: Diagnosis not present

## 2021-05-18 DIAGNOSIS — Z113 Encounter for screening for infections with a predominantly sexual mode of transmission: Secondary | ICD-10-CM

## 2021-05-18 DIAGNOSIS — Z131 Encounter for screening for diabetes mellitus: Secondary | ICD-10-CM | POA: Diagnosis not present

## 2021-05-18 DIAGNOSIS — Z3402 Encounter for supervision of normal first pregnancy, second trimester: Secondary | ICD-10-CM

## 2021-05-18 NOTE — Progress Notes (Signed)
Routine Prenatal Care Visit  Subjective  Tiffany Booth is a 25 y.o. G1P0 at [redacted]w[redacted]d being seen today for ongoing prenatal care.  She is currently monitored for the following issues for this low-risk pregnancy and has Supervision of normal pregnancy; Obesity affecting pregnancy; and [name] of fetus on prenatal ultrasound on their problem list.  ----------------------------------------------------------------------------------- Patient reports no complaints.   Contractions: Not present. Vag. Bleeding: None.  Movement: Present. Leaking Fluid denies.  ----------------------------------------------------------------------------------- The following portions of the patient's history were reviewed and updated as appropriate: allergies, current medications, past family history, past medical history, past social history, past surgical history and problem list. Problem list updated.  Objective  Blood pressure 118/78, weight 235 lb (106.6 kg), last menstrual period 12/03/2020. Pregravid weight 216 lb (98 kg) Total Weight Gain 19 lb (8.618 kg) Urinalysis: Urine Protein    Urine Glucose    Fetal Status: Fetal Heart Rate (bpm): 145   Movement: Present     General:  Alert, oriented and cooperative. Patient is in no acute distress.  Skin: Skin is warm and dry. No rash noted.   Cardiovascular: Normal heart rate noted  Respiratory: Normal respiratory effort, no problems with respiration noted  Abdomen: Soft, gravid, appropriate for gestational age. Pain/Pressure: Absent     Pelvic:  Cervical exam deferred        Extremities: Normal range of motion.     Mental Status: Normal mood and affect. Normal behavior. Normal judgment and thought content.   Assessment   25 y.o. G1P0 at [redacted]w[redacted]d by  09/09/2021, by Last Menstrual Period presenting for routine prenatal visit  Plan   Pregnancy #1 Problems (from 04/04/21 to present)     Problem Noted Resolved   [name] of fetus on prenatal ultrasound  05/18/2021 by Conard Novak, MD No   Overview Signed 05/18/2021 10:14 AM by Conard Novak, MD    - MFM following (6 and 7 mm respectively)      Supervision of normal pregnancy 04/04/2021 by Tresea Mall, CNM No   Overview Addendum 04/12/2021  2:23 PM by Nadara Mustard, MD     Nursing Staff Provider  Office Location  [facility] Dating  LMP, confirmed by 18wk [place]  Language  English Anatomy US    Flu Vaccine   Genetic Screen  NIPS: nml XY  TDaP vaccine    Hgb A1C or  GTT Third trimester :   Covid    LAB RESULTS   Rhogam   Blood Type B/Positive/-- (09/12 1109)   Feeding Plan Breast Antibody Negative (09/12 1109)  Contraception  Rubella 6.39 (09/12 1109)  Circumcision  RPR Non Reactive (09/12 1109)   Pediatrician   HBsAg Negative (09/12 1109)   Support Person Partner [name] Her mom Vickie HIV Non Reactive (09/12 1109)  Prenatal Classes  Varicella Immune    GBS  (For PCN allergy, check sensitivities)   BTL Consent no    VBAC Consent NA Pap  1st PAP @ NOB 04/04/21    Hgb Electro    Pelvis Tested NA CF      SMA              Obesity affecting pregnancy 04/04/2021 by Tresea Mall, CNM No        Preterm labor symptoms and general obstetric precautions including but not limited to vaginal bleeding, contractions, leaking of fluid and fetal movement were reviewed in detail with the patient. Please refer to After Visit Summary for other counseling recommendations.   Followed  by MFM for fetal bilateral pylectasis (6 and 71mm).    Return in about 4 weeks (around 06/15/2021) for 28 wk labs with routine prenatal.   Thomasene Mohair, MD, [facility], [facility] 05/18/2021 10:28 AM

## 2021-05-20 ENCOUNTER — Telehealth: Payer: Self-pay | Admitting: Genetics

## 2021-05-20 NOTE — Telephone Encounter (Signed)
Called Jefm Miles to review her carrier screening results. The results are screen negative for alpha thalassemia, beta hemoglobinopathies, cystic fibrosis, and spinal muscular atrophy. A negative result on carrier screening reduces the likelihood of being a carrier, however, does not entirely rule out the possibility. All questions answered.

## 2021-05-24 DIAGNOSIS — Z419 Encounter for procedure for purposes other than remedying health state, unspecified: Secondary | ICD-10-CM | POA: Diagnosis not present

## 2021-06-06 ENCOUNTER — Ambulatory Visit (INDEPENDENT_AMBULATORY_CARE_PROVIDER_SITE_OTHER): Payer: Medicaid Other | Admitting: Internal Medicine

## 2021-06-06 ENCOUNTER — Encounter: Payer: Self-pay | Admitting: Internal Medicine

## 2021-06-06 ENCOUNTER — Other Ambulatory Visit: Payer: Self-pay

## 2021-06-06 VITALS — BP 107/74 | HR 99 | Temp 99.0°F | Ht 66.54 in | Wt 235.2 lb

## 2021-06-06 DIAGNOSIS — M79606 Pain in leg, unspecified: Secondary | ICD-10-CM

## 2021-06-06 DIAGNOSIS — Z3A26 26 weeks gestation of pregnancy: Secondary | ICD-10-CM

## 2021-06-06 DIAGNOSIS — B354 Tinea corporis: Secondary | ICD-10-CM

## 2021-06-06 MED ORDER — CLOTRIMAZOLE-BETAMETHASONE 1-0.05 % EX CREA: 1.0000 "application " | TOPICAL_CREAM | Freq: Two times a day (BID) | CUTANEOUS | 0 refills | Status: DC

## 2021-06-06 NOTE — Progress Notes (Signed)
BP 107/74   Pulse 99   Temp 99 F (37.2 C) (Oral)   Ht 5' 6.54" (1.69 m)   Wt 235 lb 3.2 oz (106.7 kg)   LMP 12/03/2020 (Exact Date)   SpO2 98%   BMI 37.35 kg/m    Subjective:    Patient ID: Tiffany Booth, female    DOB: 30-Jul-1995, 25 y.o.   MRN: [id number]  Chief Complaint  Patient presents with   Leg Pain    Seen Ortho, but did not go to the recommended therapy     HPI: Tiffany Booth is a 25 y.o. female  Pt is here for a fu , has had a problem with the fetus who is 26 weeks now. She is seeing MFM for echogenicity in the [place] abnl cardiac and nephrology. Genetic testing -ve for trisomies.   Leg Pain  Incident onset: tingling and numbness in the right lower ext. The quality of the pain is described as burning and shooting. The pain is at a severity of 4/10. The pain is mild. Associated symptoms include an inability to bear weight, numbness and tingling. Pertinent negatives include no loss of motion, loss of sensation or muscle weakness. 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 .   Chief Complaint  Patient presents with   Leg Pain    Seen Ortho, but did not go to the recommended therapy     Relevant past medical, surgical, family and social history reviewed and updated as indicated. Interim medical history since our last visit reviewed. Allergies and medications reviewed and updated.  Review of Systems  Respiratory:  Negative for apnea, cough and shortness of breath.   Gastrointestinal:  Negative for abdominal distention and abdominal pain.  Genitourinary:  Negative for decreased urine volume, difficulty urinating, dyspareunia, pelvic pain and urgency.  Musculoskeletal:  Positive for myalgias. Negative for arthralgias, gait problem, joint swelling and neck pain.  Neurological:  Positive for tingling and numbness. Negative for dizziness, seizures, facial asymmetry, light-headedness and headaches.   Per HPI unless  specifically indicated above     Objective:    BP 107/74   Pulse 99   Temp 99 F (37.2 C) (Oral)   Ht 5' 6.54" (1.69 m)   Wt 235 lb 3.2 oz (106.7 kg)   LMP 12/03/2020 (Exact Date)   SpO2 98%   BMI 37.35 kg/m   Wt Readings from Last 3 Encounters:  06/06/21 235 lb 3.2 oz (106.7 kg)  05/18/21 235 lb (106.6 kg)  04/20/21 224 lb (101.6 kg)    Physical Exam Vitals and nursing note reviewed.  Constitutional:      General: She is not in acute distress.    Appearance: Normal appearance. She is not ill-appearing or diaphoretic.  Eyes:     Conjunctiva/sclera: Conjunctivae normal.  Pulmonary:     Breath sounds: No rhonchi.  Abdominal:     General: Abdomen is flat. Bowel sounds are normal. There is no distension.     Palpations: Abdomen is soft. There is no mass.     Tenderness: There is no abdominal tenderness. There is no guarding.  Skin:    General: Skin is warm and dry.     Coloration: Skin is not jaundiced.     Findings: No erythema.  Neurological:     Mental Status: She is alert.    Results for orders placed or performed in visit on 04/05/21  CBC with Differential/Platelet  Result Value Ref  Range   WBC 8.6 3.4 - 10.8 x10E3/uL   RBC 4.37 3.77 - 5.28 x10E6/uL   Hemoglobin 12.0 11.1 - 15.9 g/dL   Hematocrit 36.4 34.0 - 46.6 %   MCV 83 79 - 97 fL   MCH 27.5 26.6 - 33.0 pg   MCHC 33.0 31.5 - 35.7 g/dL   RDW 13.0 11.7 - 15.4 %   Platelets 251 150 - 450 x10E3/uL   Neutrophils 66 Not Estab. %   Lymphs 23 Not Estab. %   Monocytes 8 Not Estab. %   Eos 2 Not Estab. %   Basos 1 Not Estab. %   Neutrophils Absolute 5.7 1.4 - 7.0 x10E3/uL   Lymphocytes Absolute 1.9 0.7 - 3.1 x10E3/uL   Monocytes Absolute 0.7 0.1 - 0.9 x10E3/uL   EOS (ABSOLUTE) 0.2 0.0 - 0.4 x10E3/uL   Basophils Absolute 0.0 0.0 - 0.2 x10E3/uL   Immature Granulocytes 0 Not Estab. %   Immature Grans (Abs) 0.0 0.0 - 0.1 x10E3/uL  Comprehensive metabolic panel  Result Value Ref Range   Glucose 71 65 - 99  mg/dL   BUN 5 (L) 6 - 20 mg/dL   Creatinine, Ser 0.58 0.57 - 1.00 mg/dL   eGFR 131 >59 mL/min/1.73   BUN/Creatinine Ratio 9 9 - 23   Sodium 136 134 - 144 mmol/L   Potassium 4.0 3.5 - 5.2 mmol/L   Chloride 102 96 - 106 mmol/L   CO2 21 20 - 29 mmol/L   Calcium 9.5 8.7 - 10.2 mg/dL   Total Protein 6.7 6.0 - 8.5 g/dL   Albumin 4.2 3.9 - 5.0 g/dL   Globulin, Total 2.5 1.5 - 4.5 g/dL   Albumin/Globulin Ratio 1.7 1.2 - 2.2   Bilirubin Total 0.3 0.0 - 1.2 mg/dL   Alkaline Phosphatase 64 44 - 121 IU/L   AST 13 0 - 40 IU/L   ALT 10 0 - 32 IU/L  Lipid panel  Result Value Ref Range   Cholesterol, Total 188 100 - 199 mg/dL   Triglycerides 82 0 - 149 mg/dL   HDL 96 >39 mg/dL   VLDL Cholesterol Cal 15 5 - 40 mg/dL   LDL Chol Calc (NIH) 77 0 - 99 mg/dL   Chol/HDL Ratio 2.0 0.0 - 4.4 ratio  TSH  Result Value Ref Range   TSH 1.490 0.450 - 4.500 uIU/mL  Urinalysis, Routine w reflex microscopic  Result Value Ref Range   Specific Gravity, UA >1.030 (H) 1.005 - 1.030   pH, UA 6.5 5.0 - 7.5   Color, UA Yellow Yellow   Appearance Ur Cloudy (A) Clear   Leukocytes,UA Negative Negative   Protein,UA Negative Negative/Trace   Glucose, UA Negative Negative   Ketones, UA Negative Negative   RBC, UA Negative Negative   Bilirubin, UA Negative Negative   Urobilinogen, Ur 0.2 0.2 - 1.0 mg/dL   Nitrite, UA Negative Negative        Current Outpatient Medications:    ondansetron (ZOFRAN ODT) 4 MG disintegrating tablet, Take 1 tablet (4 mg total) by mouth every 8 (eight) hours as needed for nausea., Disp: 20 tablet, Rfl: 2   Prenatal Vit-Fe Fumarate-FA (MULTIVITAMIN-PRENATAL) 27-0.8 MG TABS tablet, Take 1 tablet by mouth daily at 12 noon., Disp: , Rfl:     Assessment & Plan:  Leg pain : needs to take frequent breaks per Ob gyn. Her job isnt allowing such, will quit says she has d/w her mamager and will ? Be on FMLA   2.  Tinea corporis : underneath bil breasts  Wills tart pt on clotrimazole for such  use bid , to fu with obgyn for the above.   3. Pregannt [redacted] weeks Pregnant fu with Ob ygn and MFM- has had renal and cardiac abnl on [place] to fu on such with MFM.     Problem List Items Addressed This Visit   None    No orders of the defined types were placed in this encounter.   Meds ordered this encounter  Medications   clotrimazole-betamethasone (LOTRISONE) cream    Sig: Apply 1 application topically 2 (two) times daily.    Dispense:  30 g    Refill:  0    Follow up plan: No follow-ups on file.

## 2021-06-08 ENCOUNTER — Ambulatory Visit: Payer: Medicaid Other | Admitting: *Deleted

## 2021-06-08 ENCOUNTER — Encounter: Payer: Self-pay | Admitting: *Deleted

## 2021-06-08 ENCOUNTER — Other Ambulatory Visit: Payer: Self-pay

## 2021-06-08 ENCOUNTER — Ambulatory Visit: Payer: Medicaid Other | Attending: Obstetrics

## 2021-06-08 VITALS — BP 118/72 | HR 94

## 2021-06-08 DIAGNOSIS — O99212 Obesity complicating pregnancy, second trimester: Secondary | ICD-10-CM

## 2021-06-08 DIAGNOSIS — Z362 Encounter for other antenatal screening follow-up: Secondary | ICD-10-CM

## 2021-06-08 DIAGNOSIS — Z3402 Encounter for supervision of normal first pregnancy, second trimester: Secondary | ICD-10-CM

## 2021-06-08 DIAGNOSIS — O35EXX0 Maternal care for other (suspected) fetal abnormality and damage, fetal genitourinary anomalies, not applicable or unspecified: Secondary | ICD-10-CM

## 2021-06-08 DIAGNOSIS — E669 Obesity, unspecified: Secondary | ICD-10-CM | POA: Diagnosis not present

## 2021-06-08 DIAGNOSIS — Z148 Genetic carrier of other disease: Secondary | ICD-10-CM | POA: Insufficient documentation

## 2021-06-08 DIAGNOSIS — O283 Abnormal ultrasonic finding on antenatal screening of mother: Secondary | ICD-10-CM | POA: Diagnosis not present

## 2021-06-08 DIAGNOSIS — Z3A26 26 weeks gestation of pregnancy: Secondary | ICD-10-CM | POA: Diagnosis not present

## 2021-06-09 ENCOUNTER — Telehealth: Payer: Self-pay

## 2021-06-09 ENCOUNTER — Other Ambulatory Visit: Payer: Self-pay | Admitting: *Deleted

## 2021-06-09 DIAGNOSIS — Z3A26 26 weeks gestation of pregnancy: Secondary | ICD-10-CM | POA: Diagnosis not present

## 2021-06-09 DIAGNOSIS — O35EXX0 Maternal care for other (suspected) fetal abnormality and damage, fetal genitourinary anomalies, not applicable or unspecified: Secondary | ICD-10-CM

## 2021-06-09 DIAGNOSIS — O99891 Other specified diseases and conditions complicating pregnancy: Secondary | ICD-10-CM | POA: Diagnosis not present

## 2021-06-09 DIAGNOSIS — H538 Other visual disturbances: Secondary | ICD-10-CM | POA: Diagnosis not present

## 2021-06-09 DIAGNOSIS — R519 Headache, unspecified: Secondary | ICD-10-CM | POA: Diagnosis not present

## 2021-06-09 NOTE — Telephone Encounter (Signed)
Pt calling; has a real bad H/A; blurry vision.  [phone]  Pt crying so could hardly understand what she was saying.  Her head hurts so bad and it's not going away; has visual changes; FOB on the way to pick her up and take her to the hosp.  Adv to go to [facility] ED -  not sure if they will keep her to send her upstairs. Stephanie notified in L&D.

## 2021-06-10 ENCOUNTER — Telehealth: Payer: Self-pay

## 2021-06-10 NOTE — Telephone Encounter (Signed)
Transition Care Management Unsuccessful Follow-up Telephone Call  Date of discharge and from where:  06/09/2021 from [facility]  Attempts:  1st Attempt  Reason for unsuccessful TCM follow-up call:  Left voice message

## 2021-06-13 NOTE — Telephone Encounter (Signed)
Transition Care Management Follow-up Telephone Call Date of discharge and from where: 06/09/2021 from [facility] How have you been since you were released from the hospital? Pt stated that she is feeling better. Pt did not have any questions or concerns.  Any questions or concerns? No  Items Reviewed: Did the pt receive and understand the discharge instructions provided? Yes  Medications obtained and verified? Yes  Other? No  Any new allergies since your discharge? No  Dietary orders reviewed? No Do you have support at home? Yes   Functional Questionnaire: (I = Independent and D = Dependent) ADLs: I  Bathing/Dressing- I  Meal Prep- I  Eating- I  Maintaining continence- I  Transferring/Ambulation- I  Managing Meds- I   Follow up appointments reviewed:  PCP Hospital f/u appt confirmed? No   Specialist Hospital f/u appt confirmed? Yes  Scheduled to see [facility] on 06/14/2021.  Are transportation arrangements needed? No  If their condition worsens, is the pt aware to call PCP or go to the Emergency Dept.? Yes Was the patient provided with contact information for the PCP's office or ED? Yes Was to pt encouraged to call back with questions or concerns? Yes

## 2021-06-14 DIAGNOSIS — Z3493 Encounter for supervision of normal pregnancy, unspecified, third trimester: Secondary | ICD-10-CM | POA: Diagnosis not present

## 2021-06-15 ENCOUNTER — Other Ambulatory Visit: Payer: Medicaid Other

## 2021-06-15 ENCOUNTER — Encounter: Payer: Medicaid Other | Admitting: Obstetrics and Gynecology

## 2021-06-23 DIAGNOSIS — Z419 Encounter for procedure for purposes other than remedying health state, unspecified: Secondary | ICD-10-CM | POA: Diagnosis not present

## 2021-06-28 DIAGNOSIS — N83201 Unspecified ovarian cyst, right side: Secondary | ICD-10-CM | POA: Diagnosis not present

## 2021-06-28 DIAGNOSIS — O3483 Maternal care for other abnormalities of pelvic organs, third trimester: Secondary | ICD-10-CM | POA: Diagnosis not present

## 2021-06-28 DIAGNOSIS — Z3A29 29 weeks gestation of pregnancy: Secondary | ICD-10-CM | POA: Diagnosis not present

## 2021-06-28 DIAGNOSIS — Z0373 Encounter for suspected fetal anomaly ruled out: Secondary | ICD-10-CM | POA: Diagnosis not present

## 2021-06-30 ENCOUNTER — Other Ambulatory Visit: Payer: Self-pay

## 2021-06-30 ENCOUNTER — Ambulatory Visit (INDEPENDENT_AMBULATORY_CARE_PROVIDER_SITE_OTHER): Payer: Medicaid Other | Admitting: Nurse Practitioner

## 2021-06-30 ENCOUNTER — Encounter: Payer: Self-pay | Admitting: Nurse Practitioner

## 2021-06-30 VITALS — BP 110/70 | HR 103 | Temp 97.9°F | Wt 236.0 lb

## 2021-06-30 DIAGNOSIS — J029 Acute pharyngitis, unspecified: Secondary | ICD-10-CM

## 2021-06-30 LAB — VERITOR FLU A/B WAIVED
Influenza A: NEGATIVE
Influenza B: NEGATIVE

## 2021-06-30 MED ORDER — LIDOCAINE VISCOUS HCL 2 % MT SOLN: 15.0000 mL | Freq: Four times a day (QID) | OROMUCOSAL | 0 refills | Status: DC | PRN

## 2021-06-30 NOTE — Progress Notes (Signed)
Acute Office Visit  Subjective:    Patient ID: Tiffany Booth, female    DOB: 1996-02-11, 25 y.o.   MRN: [id number]  Chief Complaint  Patient presents with   Sore Throat    Pt states she has been having a sore throat for the past week. States it hurts worse in the mornings.     HPI Patient is in today for sore throat in the mornings for the past week.  UPPER RESPIRATORY TRACT INFECTION  Worst symptom: sore throat Fever: no Cough:  a little Shortness of breath: no Wheezing: no Chest pain: no Chest tightness: no Chest congestion: no Nasal congestion: no Runny nose: no Post nasal drip: no Sneezing: no Sore throat: yes Swollen glands: no Sinus pressure: no Headache: yes Face pain: no Toothache: no Ear pain: no  Ear pressure: no  Eyes red/itching:no Eye drainage/crusting: no  Vomiting: no Rash: no Fatigue: yes Sick contacts: yes - mom and brother tested positive for flu on Monday Strep contacts: no  Context: fluctuating Recurrent sinusitis: no Relief with OTC cold/cough medications: no  Treatments attempted: tylenol cold and flu   Past Medical History:  Diagnosis Date   Medical history non-contributory    Ovarian cyst     Past Surgical History:  Procedure Laterality Date   SPINE SURGERY     WISDOM TOOTH EXTRACTION      Family History  Problem Relation Age of Onset   Hypertension Mother    Breast cancer Maternal Grandmother 6    Social History   Socioeconomic History   Marital status: Single    Spouse name: Not on file   Number of children: Not on file   Years of education: Not on file   Highest education level: Not on file  Occupational History   Not on file  Tobacco Use   Smoking status: Never   Smokeless tobacco: Never  Vaping Use   Vaping Use: Former   Substances: Nicotine, Flavoring  Substance and Sexual Activity   Alcohol use: Never   Drug use: Never   Sexual activity: Yes  Other Topics Concern   Not on file  Social  History Narrative   Not on file   Social Determinants of Health   Financial Resource Strain: Not on file  Food Insecurity: Not on file  Transportation Needs: Not on file  Physical Activity: Not on file  Stress: Not on file  Social Connections: Not on file  Intimate Partner Violence: Not on file    Outpatient Medications Prior to Visit  Medication Sig Dispense Refill   ASPIRIN LOW DOSE 81 MG EC tablet Take 81 mg by mouth daily.     clotrimazole-betamethasone (LOTRISONE) cream Apply 1 application topically 2 (two) times daily. 30 g 0   ondansetron (ZOFRAN ODT) 4 MG disintegrating tablet Take 1 tablet (4 mg total) by mouth every 8 (eight) hours as needed for nausea. 20 tablet 2   Prenatal Vit-Fe Fumarate-FA (MULTIVITAMIN-PRENATAL) 27-0.8 MG TABS tablet Take 1 tablet by mouth daily at 12 noon.     No facility-administered medications prior to visit.    No Known Allergies  Review of Systems  Constitutional:  Positive for fatigue. Negative for fever.  HENT:  Positive for sore throat and voice change. Negative for congestion, ear pain, postnasal drip and rhinorrhea.   Respiratory:  Positive for cough. Negative for shortness of breath.   Gastrointestinal: Negative.   Genitourinary: Negative.   Musculoskeletal: Negative.   Skin: Negative.   Neurological:  Positive for headaches. Negative for dizziness.      Objective:    Physical Exam Vitals and nursing note reviewed.  Constitutional:      General: She is not in acute distress.    Appearance: Normal appearance.  HENT:     Head: Normocephalic.     Right Ear: Tympanic membrane, ear canal and external ear normal.     Left Ear: Tympanic membrane, ear canal and external ear normal.     Mouth/Throat:     Mouth: Mucous membranes are moist.     Pharynx: Posterior oropharyngeal erythema present. No oropharyngeal exudate.  Eyes:     Conjunctiva/sclera: Conjunctivae normal.  Cardiovascular:     Rate and Rhythm: Normal rate and  regular rhythm.     Pulses: Normal pulses.     Heart sounds: Normal heart sounds.  Pulmonary:     Effort: Pulmonary effort is normal.     Breath sounds: Normal breath sounds.  Musculoskeletal:     Cervical back: Normal range of motion.  Skin:    General: Skin is warm.  Neurological:     General: No focal deficit present.     Mental Status: She is alert and oriented to person, place, and time.  Psychiatric:        Mood and Affect: Mood normal.        Behavior: Behavior normal.        Thought Content: Thought content normal.        Judgment: Judgment normal.    BP 110/70   Pulse (!) 103   Temp 97.9 F (36.6 C) (Oral)   Wt 236 lb (107 kg)   LMP 12/03/2020 (Exact Date)   SpO2 98%   BMI 37.48 kg/m  Wt Readings from Last 3 Encounters:  06/30/21 236 lb (107 kg)  06/06/21 235 lb 3.2 oz (106.7 kg)  05/18/21 235 lb (106.6 kg)    There are no preventive care reminders to display for this patient.  There are no preventive care reminders to display for this patient.   Lab Results  Component Value Date   TSH 1.490 04/05/2021   Lab Results  Component Value Date   WBC 8.6 04/05/2021   HGB 12.0 04/05/2021   HCT 36.4 04/05/2021   MCV 83 04/05/2021   PLT 251 04/05/2021   Lab Results  Component Value Date   NA 136 04/05/2021   K 4.0 04/05/2021   CO2 21 04/05/2021   GLUCOSE 71 04/05/2021   BUN 5 (L) 04/05/2021   CREATININE 0.58 04/05/2021   BILITOT 0.3 04/05/2021   ALKPHOS 64 04/05/2021   AST 13 04/05/2021   ALT 10 04/05/2021   PROT 6.7 04/05/2021   ALBUMIN 4.2 04/05/2021   CALCIUM 9.5 04/05/2021   EGFR 131 04/05/2021   Lab Results  Component Value Date   CHOL 188 04/05/2021   Lab Results  Component Value Date   HDL 96 04/05/2021   Lab Results  Component Value Date   LDLCALC 77 04/05/2021   Lab Results  Component Value Date   TRIG 82 04/05/2021   Lab Results  Component Value Date   CHOLHDL 2.0 04/05/2021   Lab Results  Component Value Date    HGBA1C 5.1 04/04/2021       Assessment & Plan:   Problem List Items Addressed This Visit   None Visit Diagnoses     Sore throat    -  Primary   Strep negative. Flu/covid-19 pending. Encouraged rest, fluids. Will give  viscous lidocaine to gargle/spit for sore throat. F/U if not improving   Relevant Orders   Rapid Strep screen([facility]) (Completed)   Veritor Flu A/B Waived   Novel Coronavirus, NAA ([facility])        Meds ordered this encounter  Medications   lidocaine (XYLOCAINE) 2 % solution    Sig: Use as directed 15 mLs in the mouth or throat every 6 (six) hours as needed for mouth pain. Swish, gargle and spit, do not swallow    Dispense:  100 mL    Refill:  0      Charyl Dancer, NP

## 2021-06-30 NOTE — Patient Instructions (Signed)

## 2021-07-01 LAB — NOVEL CORONAVIRUS, NAA: SARS-CoV-2, NAA: NOT DETECTED

## 2021-07-03 LAB — CULTURE, GROUP A STREP: Strep A Culture: NEGATIVE

## 2021-07-03 LAB — RAPID STREP SCREEN (MED CTR MEBANE ONLY): Strep Gp A Ag, IA W/Reflex: NEGATIVE

## 2021-07-05 ENCOUNTER — Ambulatory Visit: Payer: Self-pay

## 2021-07-19 ENCOUNTER — Ambulatory Visit: Payer: Medicaid Other

## 2021-07-24 DIAGNOSIS — Z419 Encounter for procedure for purposes other than remedying health state, unspecified: Secondary | ICD-10-CM | POA: Diagnosis not present

## 2021-07-28 DIAGNOSIS — Z23 Encounter for immunization: Secondary | ICD-10-CM | POA: Diagnosis not present

## 2021-07-28 DIAGNOSIS — Z7185 Encounter for immunization safety counseling: Secondary | ICD-10-CM | POA: Diagnosis not present

## 2021-08-18 DIAGNOSIS — Z3403 Encounter for supervision of normal first pregnancy, third trimester: Secondary | ICD-10-CM | POA: Diagnosis not present

## 2021-08-18 DIAGNOSIS — A749 Chlamydial infection, unspecified: Secondary | ICD-10-CM | POA: Diagnosis not present

## 2021-08-18 DIAGNOSIS — O98813 Other maternal infectious and parasitic diseases complicating pregnancy, third trimester: Secondary | ICD-10-CM | POA: Diagnosis not present

## 2021-08-18 DIAGNOSIS — Z113 Encounter for screening for infections with a predominantly sexual mode of transmission: Secondary | ICD-10-CM | POA: Diagnosis not present

## 2021-08-24 DIAGNOSIS — Z419 Encounter for procedure for purposes other than remedying health state, unspecified: Secondary | ICD-10-CM | POA: Diagnosis not present

## 2021-09-11 DIAGNOSIS — Z7982 Long term (current) use of aspirin: Secondary | ICD-10-CM | POA: Diagnosis not present

## 2021-09-11 DIAGNOSIS — I959 Hypotension, unspecified: Secondary | ICD-10-CM | POA: Diagnosis not present

## 2021-09-11 DIAGNOSIS — O99824 Streptococcus B carrier state complicating childbirth: Secondary | ICD-10-CM | POA: Diagnosis not present

## 2021-09-11 DIAGNOSIS — O480 Post-term pregnancy: Secondary | ICD-10-CM | POA: Diagnosis not present

## 2021-09-11 DIAGNOSIS — Z3A40 40 weeks gestation of pregnancy: Secondary | ICD-10-CM | POA: Diagnosis not present

## 2021-09-11 DIAGNOSIS — O9942 Diseases of the circulatory system complicating childbirth: Secondary | ICD-10-CM | POA: Diagnosis not present

## 2021-09-11 DIAGNOSIS — O99214 Obesity complicating childbirth: Secondary | ICD-10-CM | POA: Diagnosis not present

## 2021-09-12 DIAGNOSIS — O99824 Streptococcus B carrier state complicating childbirth: Secondary | ICD-10-CM | POA: Diagnosis not present

## 2021-09-12 DIAGNOSIS — Z3A40 40 weeks gestation of pregnancy: Secondary | ICD-10-CM | POA: Diagnosis not present

## 2021-09-12 DIAGNOSIS — B951 Streptococcus, group B, as the cause of diseases classified elsewhere: Secondary | ICD-10-CM | POA: Diagnosis not present

## 2021-09-21 DIAGNOSIS — Z419 Encounter for procedure for purposes other than remedying health state, unspecified: Secondary | ICD-10-CM | POA: Diagnosis not present

## 2021-10-17 DIAGNOSIS — Z30430 Encounter for insertion of intrauterine contraceptive device: Secondary | ICD-10-CM | POA: Diagnosis not present

## 2021-10-17 DIAGNOSIS — B9689 Other specified bacterial agents as the cause of diseases classified elsewhere: Secondary | ICD-10-CM | POA: Diagnosis not present

## 2021-10-17 DIAGNOSIS — N760 Acute vaginitis: Secondary | ICD-10-CM | POA: Diagnosis not present

## 2021-10-17 DIAGNOSIS — Z308 Encounter for other contraceptive management: Secondary | ICD-10-CM | POA: Diagnosis not present

## 2021-10-17 DIAGNOSIS — Z1332 Encounter for screening for maternal depression: Secondary | ICD-10-CM | POA: Diagnosis not present

## 2021-10-22 DIAGNOSIS — Z419 Encounter for procedure for purposes other than remedying health state, unspecified: Secondary | ICD-10-CM | POA: Diagnosis not present

## 2021-11-21 DIAGNOSIS — Z419 Encounter for procedure for purposes other than remedying health state, unspecified: Secondary | ICD-10-CM | POA: Diagnosis not present

## 2021-12-22 DIAGNOSIS — Z419 Encounter for procedure for purposes other than remedying health state, unspecified: Secondary | ICD-10-CM | POA: Diagnosis not present

## 2022-01-04 ENCOUNTER — Ambulatory Visit: Payer: Medicaid Other | Admitting: Internal Medicine

## 2022-01-21 DIAGNOSIS — Z419 Encounter for procedure for purposes other than remedying health state, unspecified: Secondary | ICD-10-CM | POA: Diagnosis not present

## 2022-02-21 DIAGNOSIS — Z419 Encounter for procedure for purposes other than remedying health state, unspecified: Secondary | ICD-10-CM | POA: Diagnosis not present

## 2022-03-24 DIAGNOSIS — Z419 Encounter for procedure for purposes other than remedying health state, unspecified: Secondary | ICD-10-CM | POA: Diagnosis not present

## 2022-04-23 DIAGNOSIS — Z419 Encounter for procedure for purposes other than remedying health state, unspecified: Secondary | ICD-10-CM | POA: Diagnosis not present

## 2022-05-24 DIAGNOSIS — Z419 Encounter for procedure for purposes other than remedying health state, unspecified: Secondary | ICD-10-CM | POA: Diagnosis not present

## 2022-06-07 ENCOUNTER — Encounter: Payer: Medicaid Other | Admitting: Physician Assistant

## 2022-06-07 NOTE — Progress Notes (Deleted)
          Established Patient Office Visit  Name: Tiffany Booth   MRN: [id number]    DOB: October 20, 1995   Date:06/07/2022  Today's Provider: Jacquelin Hawking, MHS, PA-C Introduced myself to the patient as a PA-C and provided education on APPs in clinical practice.         Subjective  Chief Complaint  No chief complaint on file.   HPI   Patient Active Problem List   Diagnosis Date Noted   [redacted] weeks gestation of pregnancy 06/06/2021   Tinea corporis 06/06/2021   Pain of lower extremity 06/06/2021   [name] of fetus on prenatal ultrasound 05/18/2021   Supervision of normal pregnancy 04/04/2021   Obesity affecting pregnancy 04/04/2021    Past Surgical History:  Procedure Laterality Date   SPINE SURGERY     WISDOM TOOTH EXTRACTION      Family History  Problem Relation Age of Onset   Hypertension Mother    Breast cancer Maternal Grandmother 62    Social History   Tobacco Use   Smoking status: Never   Smokeless tobacco: Never  Substance Use Topics   Alcohol use: Never     Current Outpatient Medications:    ASPIRIN LOW DOSE 81 MG EC tablet, Take 81 mg by mouth daily., Disp: , Rfl:    clotrimazole-betamethasone (LOTRISONE) cream, Apply 1 application topically 2 (two) times daily., Disp: 30 g, Rfl: 0   lidocaine (XYLOCAINE) 2 % solution, Use as directed 15 mLs in the mouth or throat every 6 (six) hours as needed for mouth pain. Swish, gargle and spit, do not swallow, Disp: 100 mL, Rfl: 0   ondansetron (ZOFRAN ODT) 4 MG disintegrating tablet, Take 1 tablet (4 mg total) by mouth every 8 (eight) hours as needed for nausea., Disp: 20 tablet, Rfl: 2   Prenatal Vit-Fe Fumarate-FA (MULTIVITAMIN-PRENATAL) 27-0.8 MG TABS tablet, Take 1 tablet by mouth daily at 12 noon., Disp: , Rfl:   No Known Allergies  I personally reviewed {Reviewed:14835} with the patient/caregiver today.   ROS    Objective  There were no vitals filed for this visit.  There is no height or  weight on file to calculate BMI.  Physical Exam   No results found for this or any previous visit (from the past 2160 hour(s)).   PHQ2/9:    06/06/2021    9:01 AM 04/05/2021    8:43 AM  Depression screen PHQ 2/9  Decreased Interest 3 0  Down, Depressed, Hopeless 0 0  PHQ - 2 Score 3 0  Altered sleeping 3 0  Tired, decreased energy 3 0  Change in appetite 0 0  Feeling bad or failure about yourself  0 0  Trouble concentrating 0 0  Moving slowly or fidgety/restless 0 0  Suicidal thoughts 0 0  PHQ-9 Score 9 0  Difficult doing work/chores  Not difficult at all      Fall Risk:    06/06/2021    9:01 AM 04/05/2021    8:43 AM  Fall Risk   Falls in the past year? 0 0  Number falls in past yr: 0 0  Injury with Fall? 0 0  Risk for fall due to : No Fall Risks No Fall Risks  Follow up Falls evaluation completed Falls evaluation completed      Functional Status Survey:      Assessment & Plan

## 2022-06-23 DIAGNOSIS — Z419 Encounter for procedure for purposes other than remedying health state, unspecified: Secondary | ICD-10-CM | POA: Diagnosis not present

## 2022-07-24 DIAGNOSIS — Z419 Encounter for procedure for purposes other than remedying health state, unspecified: Secondary | ICD-10-CM | POA: Diagnosis not present

## 2022-08-24 DIAGNOSIS — Z419 Encounter for procedure for purposes other than remedying health state, unspecified: Secondary | ICD-10-CM | POA: Diagnosis not present

## 2022-09-22 DIAGNOSIS — Z419 Encounter for procedure for purposes other than remedying health state, unspecified: Secondary | ICD-10-CM | POA: Diagnosis not present

## 2022-10-23 DIAGNOSIS — Z419 Encounter for procedure for purposes other than remedying health state, unspecified: Secondary | ICD-10-CM | POA: Diagnosis not present

## 2022-11-02 ENCOUNTER — Ambulatory Visit: Payer: Medicaid Other | Admitting: Nurse Practitioner

## 2022-11-02 NOTE — Progress Notes (Deleted)
There were no vitals taken for this visit.   Subjective:    Patient ID: Tiffany Booth, female    DOB: 1995-07-28, 27 y.o.   MRN: [id number]  HPI: Tiffany Booth is a 27 y.o. female presenting on 11/02/2022 for comprehensive medical examination. Current medical complaints include:{Blank single:19197::"none","***"}  She currently lives with: Menopausal Symptoms: {Blank single:19197::"yes","no"}  FOOT PAIN Duration: {Blank single:19197::"chronic","days","weeks","months"} Involved foot: {Blank single:19197::"left","right","bilateral"} Mechanism of injury: {Blank single:19197::"trauma","unknown"} Location:  Onset: {Blank single:19197::"sudden","gradual"}  Severity: {Blank single:19197::"mild","moderate","severe","1/10","2/10","3/10","4/10","5/10","6/10","7/10","8/10","9/10","10/10"}  Quality:  {Blank multiple:19196::"sharp","dull","aching","burning","cramping","ill-defined","itchy","pressure-like","pulling","shooting","sore","stabbing","tender","tearing","throbbing"} Frequency: {Blank single:19197::"constant","intermittent","occasional","rare","every few minutes","a few times a hour","a few times a day","a few times a week","a few times a month","a few times a year"} Radiation: {Blank single:19197::"yes","no"} Aggravating factors: {Blank multiple:19196::"weight bearing","walking","running","stairs","bending","movement","prolonged sitting"}  Alleviating factors: {Blank multiple:19196::"nothing","ice","physical therapy","HEP","APAP","NSAIDs","brace","crutches","rest"}  Status: {Blank multiple:19196::"better","worse","stable","fluctuating"} Treatments attempted: {Blank multiple:19196::"none","rest","ice","heat","APAP","ibuprofen","aleve","physical therapy","HEP"}  Relief with NSAIDs?:  {Blank single:19197::"No NSAIDs Taken","no","mild","moderate","significant"} Weakness with weight bearing or walking: {Blank single:19197::"yes","no"} Morning stiffness: {Blank  single:19197::"yes","no"} Swelling: {Blank single:19197::"yes","no"} Redness: {Blank single:19197::"yes","no"} Bruising: {Blank single:19197::"yes","no"} Paresthesias / decreased sensation: {Blank single:19197::"yes","no"}  Fevers:{Blank single:19197::"yes","no"}   Depression Screen done today and results listed below:     06/06/2021    9:01 AM 04/05/2021    8:43 AM  Depression screen PHQ 2/9  Decreased Interest 3 0  Down, Depressed, Hopeless 0 0  PHQ - 2 Score 3 0  Altered sleeping 3 0  Tired, decreased energy 3 0  Change in appetite 0 0  Feeling bad or failure about yourself  0 0  Trouble concentrating 0 0  Moving slowly or fidgety/restless 0 0  Suicidal thoughts 0 0  PHQ-9 Score 9 0  Difficult doing work/chores  Not difficult at all    The patient {has/does not OOIL:57972} a history of falls. I {did/did not:19850} complete a risk assessment for falls. A plan of care for falls {was/was not:19852} documented.   Past Medical History:  Past Medical History:  Diagnosis Date   Medical history non-contributory    Ovarian cyst     Surgical History:  Past Surgical History:  Procedure Laterality Date   SPINE SURGERY     WISDOM TOOTH EXTRACTION      Medications:  Current Outpatient Medications on File Prior to Visit  Medication Sig   ASPIRIN LOW DOSE 81 MG EC tablet Take 81 mg by mouth daily.   clotrimazole-betamethasone (LOTRISONE) cream Apply 1 application topically 2 (two) times daily.   lidocaine (XYLOCAINE) 2 % solution Use as directed 15 mLs in the mouth or throat every 6 (six) hours as needed for mouth pain. Swish, gargle and spit, do not swallow   ondansetron (ZOFRAN ODT) 4 MG disintegrating tablet Take 1 tablet (4 mg total) by mouth every 8 (eight) hours as needed for nausea.   Prenatal Vit-Fe Fumarate-FA (MULTIVITAMIN-PRENATAL) 27-0.8 MG TABS tablet Take 1 tablet by mouth daily at 12 noon.   No current facility-administered medications on file prior to visit.     Allergies:  No Known Allergies  Social History:  Social History   Socioeconomic History   Marital status: Single    Spouse name: Not on file   Number of children: Not on file   Years of education: Not on file   Highest education level: 12th grade  Occupational History   Not on file  Tobacco Use   Smoking status: Never   Smokeless tobacco: Never  Vaping Use   Vaping Use: Former   Substances: Nicotine, Flavoring  Substance and Sexual Activity   Alcohol use: Never  Drug use: Never   Sexual activity: Yes  Other Topics Concern   Not on file  Social History Narrative   Not on file   Social Determinants of Health   Financial Resource Strain: Low Risk  (11/01/2022)   Overall Financial Resource Strain (CARDIA)    Difficulty of Paying Living Expenses: Not hard at all  Food Insecurity: No Food Insecurity (11/01/2022)   Hunger Vital Sign    Worried About Running Out of Food in the Last Year: Never true    Ran Out of Food in the Last Year: Never true  Transportation Needs: No Transportation Needs (11/01/2022)   PRAPARE - Administrator, Civil ServiceTransportation    Lack of Transportation (Medical): No    Lack of Transportation (Non-Medical): No  Physical Activity: Insufficiently Active (11/01/2022)   Exercise Vital Sign    Days of Exercise per Week: 2 days    Minutes of Exercise per Session: 30 min  Stress: Stress Concern Present (11/01/2022)   [employer] - Occupational Stress Questionnaire    Feeling of Stress : To some extent  Social Connections: Moderately Isolated (11/01/2022)   Social Connection and Isolation Panel [NHANES]    Frequency of Communication with Friends and Family: Three times a week    Frequency of Social Gatherings with Friends and Family: More than three times a week    Attends Religious Services: 1 to 4 times per year    Active Member of [employer] Financial[employer]ubs or Organizations: No    Attends Engineer, structuralClub or Organization Meetings: Not on file    Marital Status: Never  married  Catering managerntimate Partner Violence: Not on file   Social History   Tobacco Use  Smoking Status Never  Smokeless Tobacco Never   Social History   Substance and Sexual Activity  Alcohol Use Never    Family History:  Family History  Problem Relation Age of Onset   Hypertension Mother    Breast cancer Maternal Grandmother 7550    Past medical history, surgical history, medications, allergies, family history and social history reviewed with patient today and changes made to appropriate areas of the chart.   ROS All other ROS negative except what is listed above and in the HPI.      Objective:    There were no vitals taken for this visit.  Wt Readings from Last 3 Encounters:  06/30/21 236 lb (107 kg)  06/06/21 235 lb 3.2 oz (106.7 kg)  05/18/21 235 lb (106.6 kg)    Physical Exam  Results for orders placed or performed in visit on 06/30/21  Rapid Strep screen([facility])   Specimen: Other   Other  Result Value Ref Range   Strep Gp A Ag, IA W/Reflex Negative Negative  Culture, Group A Strep   Other  Result Value Ref Range   Strep A Culture Negative   Novel Coronavirus, NAA ([facility])   Specimen: Nasopharyngeal(NP) swabs in vial transport medium  Result Value Ref Range   SARS-CoV-2, NAA Not Detected Not Detected  Veritor Flu A/B Waived  Result Value Ref Range   Influenza A Negative Negative   Influenza B Negative Negative      Assessment & Plan:   Problem List Items Addressed This Visit   None    Follow up plan: No follow-ups on file.   LABORATORY TESTING:  - Pap smear: {Blank single:19197::"pap done","not applicable","up to date","done elsewhere"}  IMMUNIZATIONS:   - Tdap: Tetanus vaccination status reviewed: {tetanus status:315746}. - Influenza: {Blank single:19197::"Up to date","Administered today","Postponed to flu season","Refused","Given  elsewhere"} - Pneumovax: {Blank single:19197::"Up to date","Administered today","Not  applicable","Refused","Given elsewhere"} - Prevnar: {Blank single:19197::"Up to date","Administered today","Not applicable","Refused","Given elsewhere"} - COVID: {Blank single:19197::"Up to date","Administered today","Not applicable","Refused","Given elsewhere"} - HPV: {Blank single:19197::"Up to date","Administered today","Not applicable","Refused","Given elsewhere"} - Shingrix vaccine: {Blank single:19197::"Up to date","Administered today","Not applicable","Refused","Given elsewhere"}  SCREENING: -Mammogram: {Blank single:19197::"Up to date","Ordered today","Not applicable","Refused","Done elsewhere"}  - Colonoscopy: {Blank single:19197::"Up to date","Ordered today","Not applicable","Refused","Done elsewhere"}  - Bone Density: {Blank single:19197::"Up to date","Ordered today","Not applicable","Refused","Done elsewhere"}  -Hearing Test: {Blank single:19197::"Up to date","Ordered today","Not applicable","Refused","Done elsewhere"}  -Spirometry: {Blank single:19197::"Up to date","Ordered today","Not applicable","Refused","Done elsewhere"}   PATIENT COUNSELING:   Advised to take 1 mg of folate supplement per day if capable of pregnancy.   Sexuality: Discussed sexually transmitted diseases, partner selection, use of condoms, avoidance of unintended pregnancy  and contraceptive alternatives.   Advised to avoid cigarette smoking.  I discussed with the patient that most people either abstain from alcohol or drink within safe limits (<=14/week and <=4 drinks/occasion for males, <=7/weeks and <= 3 drinks/occasion for females) and that the risk for alcohol disorders and other health effects rises proportionally with the number of drinks per week and how often a drinker exceeds daily limits.  Discussed cessation/primary prevention of drug use and availability of treatment for abuse.   Diet: Encouraged to adjust caloric intake to maintain  or achieve ideal body weight, to reduce intake of dietary  saturated fat and total fat, to limit sodium intake by avoiding high sodium foods and not adding table salt, and to maintain adequate dietary potassium and calcium preferably from fresh fruits, vegetables, and low-fat dairy products.    stressed the importance of regular exercise  Injury prevention: Discussed safety belts, safety helmets, smoke detector, smoking near bedding or upholstery.   Dental health: Discussed importance of regular tooth brushing, flossing, and dental visits.    NEXT PREVENTATIVE PHYSICAL DUE IN 1 YEAR. No follow-ups on file.

## 2022-11-03 ENCOUNTER — Encounter: Payer: Medicaid Other | Admitting: Nurse Practitioner

## 2022-11-03 NOTE — Progress Notes (Deleted)
There were no vitals taken for this visit.   Subjective:    Patient ID: Tiffany Booth, female    DOB: 1996/07/13, 27 y.o.   MRN: [id number]  HPI: Tiffany Booth is a 27 y.o. female presenting on 11/03/2022 for comprehensive medical examination. Current medical complaints include:{Blank single:19197::"none","***"}  She currently lives with: Menopausal Symptoms: {Blank single:19197::"yes","no"}  Depression Screen done today and results listed below:     06/06/2021    9:01 AM 04/05/2021    8:43 AM  Depression screen PHQ 2/9  Decreased Interest 3 0  Down, Depressed, Hopeless 0 0  PHQ - 2 Score 3 0  Altered sleeping 3 0  Tired, decreased energy 3 0  Change in appetite 0 0  Feeling bad or failure about yourself  0 0  Trouble concentrating 0 0  Moving slowly or fidgety/restless 0 0  Suicidal thoughts 0 0  PHQ-9 Score 9 0  Difficult doing work/chores  Not difficult at all    The patient {has/does not NWGN:56213} a history of falls. I {did/did not:19850} complete a risk assessment for falls. A plan of care for falls {was/was not:19852} documented.   Past Medical History:  Past Medical History:  Diagnosis Date   Medical history non-contributory    Ovarian cyst     Surgical History:  Past Surgical History:  Procedure Laterality Date   SPINE SURGERY     WISDOM TOOTH EXTRACTION      Medications:  Current Outpatient Medications on File Prior to Visit  Medication Sig   ASPIRIN LOW DOSE 81 MG EC tablet Take 81 mg by mouth daily.   clotrimazole-betamethasone (LOTRISONE) cream Apply 1 application topically 2 (two) times daily.   lidocaine (XYLOCAINE) 2 % solution Use as directed 15 mLs in the mouth or throat every 6 (six) hours as needed for mouth pain. Swish, gargle and spit, do not swallow   ondansetron (ZOFRAN ODT) 4 MG disintegrating tablet Take 1 tablet (4 mg total) by mouth every 8 (eight) hours as needed for nausea.   Prenatal Vit-Fe Fumarate-FA  (MULTIVITAMIN-PRENATAL) 27-0.8 MG TABS tablet Take 1 tablet by mouth daily at 12 noon.   No current facility-administered medications on file prior to visit.    Allergies:  No Known Allergies  Social History:  Social History   Socioeconomic History   Marital status: Single    Spouse name: Not on file   Number of children: Not on file   Years of education: Not on file   Highest education level: 12th grade  Occupational History   Not on file  Tobacco Use   Smoking status: Never   Smokeless tobacco: Never  Vaping Use   Vaping Use: Former   Substances: Nicotine, Flavoring  Substance and Sexual Activity   Alcohol use: Never   Drug use: Never   Sexual activity: Yes  Other Topics Concern   Not on file  Social History Narrative   Not on file   Social Determinants of Health   Financial Resource Strain: Low Risk  (11/01/2022)   Overall Financial Resource Strain (CARDIA)    Difficulty of Paying Living Expenses: Not hard at all  Food Insecurity: No Food Insecurity (11/01/2022)   Hunger Vital Sign    Worried About Running Out of Food in the Last Year: Never true    Ran Out of Food in the Last Year: Never true  Transportation Needs: No Transportation Needs (11/01/2022)   PRAPARE - Administrator, Civil Service (Medical):  No    Lack of Transportation (Non-Medical): No  Physical Activity: Insufficiently Active (11/01/2022)   Exercise Vital Sign    Days of Exercise per Week: 2 days    Minutes of Exercise per Session: 30 min  Stress: Stress Concern Present (11/01/2022)   [employer] of Occupational Health - Occupational Stress Questionnaire    Feeling of Stress : To some extent  Social Connections: Moderately Isolated (11/01/2022)   Social Connection and Isolation Panel [NHANES]    Frequency of Communication with Friends and Family: Three times a week    Frequency of Social Gatherings with Friends and Family: More than three times a week    Attends Religious  Services: 1 to 4 times per year    Active Member of [employer] or Organizations: No    Attends Engineer, structural: Not on file    Marital Status: Never married  Catering manager Violence: Not on file   Social History   Tobacco Use  Smoking Status Never  Smokeless Tobacco Never   Social History   Substance and Sexual Activity  Alcohol Use Never    Family History:  Family History  Problem Relation Age of Onset   Hypertension Mother    Breast cancer Maternal Grandmother 50    Past medical history, surgical history, medications, allergies, family history and social history reviewed with patient today and changes made to appropriate areas of the chart.   ROS All other ROS negative except what is listed above and in the HPI.      Objective:    There were no vitals taken for this visit.  Wt Readings from Last 3 Encounters:  06/30/21 236 lb (107 kg)  06/06/21 235 lb 3.2 oz (106.7 kg)  05/18/21 235 lb (106.6 kg)    Physical Exam  Results for orders placed or performed in visit on 06/30/21  Rapid Strep screen([facility])   Specimen: Other   Other  Result Value Ref Range   Strep Gp A Ag, IA W/Reflex Negative Negative  Culture, Group A Strep   Other  Result Value Ref Range   Strep A Culture Negative   Novel Coronavirus, NAA ([facility])   Specimen: Nasopharyngeal(NP) swabs in vial transport medium  Result Value Ref Range   SARS-CoV-2, NAA Not Detected Not Detected  Veritor Flu A/B Waived  Result Value Ref Range   Influenza A Negative Negative   Influenza B Negative Negative      Assessment & Plan:   Problem List Items Addressed This Visit   None    Follow up plan: No follow-ups on file.   LABORATORY TESTING:  - Pap smear: {Blank single:19197::"pap done","not applicable","up to date","done elsewhere"}  IMMUNIZATIONS:   - Tdap: Tetanus vaccination status reviewed: {tetanus status:315746}. - Influenza: {Blank single:19197::"Up to  date","Administered today","Postponed to flu season","Refused","Given elsewhere"} - Pneumovax: {Blank single:19197::"Up to date","Administered today","Not applicable","Refused","Given elsewhere"} - Prevnar: {Blank single:19197::"Up to date","Administered today","Not applicable","Refused","Given elsewhere"} - COVID: {Blank single:19197::"Up to date","Administered today","Not applicable","Refused","Given elsewhere"} - HPV: {Blank single:19197::"Up to date","Administered today","Not applicable","Refused","Given elsewhere"} - Shingrix vaccine: {Blank single:19197::"Up to date","Administered today","Not applicable","Refused","Given elsewhere"}  SCREENING: -Mammogram: {Blank single:19197::"Up to date","Ordered today","Not applicable","Refused","Done elsewhere"}  - Colonoscopy: {Blank single:19197::"Up to date","Ordered today","Not applicable","Refused","Done elsewhere"}  - Bone Density: {Blank single:19197::"Up to date","Ordered today","Not applicable","Refused","Done elsewhere"}  -Hearing Test: {Blank single:19197::"Up to date","Ordered today","Not applicable","Refused","Done elsewhere"}  -Spirometry: {Blank single:19197::"Up to date","Ordered today","Not applicable","Refused","Done elsewhere"}   PATIENT COUNSELING:   Advised to take 1 mg of folate supplement per day if capable of pregnancy.  Sexuality: Discussed sexually transmitted diseases, partner selection, use of condoms, avoidance of unintended pregnancy  and contraceptive alternatives.   Advised to avoid cigarette smoking.  I discussed with the patient that most people either abstain from alcohol or drink within safe limits (<=14/week and <=4 drinks/occasion for males, <=7/weeks and <= 3 drinks/occasion for females) and that the risk for alcohol disorders and other health effects rises proportionally with the number of drinks per week and how often a drinker exceeds daily limits.  Discussed cessation/primary prevention of drug use and  availability of treatment for abuse.   Diet: Encouraged to adjust caloric intake to maintain  or achieve ideal body weight, to reduce intake of dietary saturated fat and total fat, to limit sodium intake by avoiding high sodium foods and not adding table salt, and to maintain adequate dietary potassium and calcium preferably from fresh fruits, vegetables, and low-fat dairy products.    stressed the importance of regular exercise  Injury prevention: Discussed safety belts, safety helmets, smoke detector, smoking near bedding or upholstery.   Dental health: Discussed importance of regular tooth brushing, flossing, and dental visits.    NEXT PREVENTATIVE PHYSICAL DUE IN 1 YEAR. No follow-ups on file.

## 2022-11-22 DIAGNOSIS — Z419 Encounter for procedure for purposes other than remedying health state, unspecified: Secondary | ICD-10-CM | POA: Diagnosis not present

## 2022-12-07 DIAGNOSIS — R051 Acute cough: Secondary | ICD-10-CM | POA: Diagnosis not present

## 2022-12-07 DIAGNOSIS — J029 Acute pharyngitis, unspecified: Secondary | ICD-10-CM | POA: Diagnosis not present

## 2022-12-12 DIAGNOSIS — J029 Acute pharyngitis, unspecified: Secondary | ICD-10-CM | POA: Diagnosis not present

## 2022-12-23 DIAGNOSIS — Z419 Encounter for procedure for purposes other than remedying health state, unspecified: Secondary | ICD-10-CM | POA: Diagnosis not present

## 2023-01-22 DIAGNOSIS — Z419 Encounter for procedure for purposes other than remedying health state, unspecified: Secondary | ICD-10-CM | POA: Diagnosis not present

## 2023-02-22 DIAGNOSIS — Z419 Encounter for procedure for purposes other than remedying health state, unspecified: Secondary | ICD-10-CM | POA: Diagnosis not present

## 2023-03-25 DIAGNOSIS — Z419 Encounter for procedure for purposes other than remedying health state, unspecified: Secondary | ICD-10-CM | POA: Diagnosis not present

## 2023-04-24 DIAGNOSIS — Z419 Encounter for procedure for purposes other than remedying health state, unspecified: Secondary | ICD-10-CM | POA: Diagnosis not present

## 2023-05-25 DIAGNOSIS — Z419 Encounter for procedure for purposes other than remedying health state, unspecified: Secondary | ICD-10-CM | POA: Diagnosis not present

## 2023-05-28 NOTE — Progress Notes (Unsigned)
There were no vitals taken for this visit.   Subjective:    Patient ID: Tiffany Booth, female    DOB: Aug 14, 1995, 27 y.o.   MRN: [id number]  HPI: Tiffany Booth is a 27 y.o. female presenting on 05/29/2023 for comprehensive medical examination. Current medical complaints include:{Blank single:19197::"none","***"}  She currently lives with: Menopausal Symptoms: {Blank single:19197::"yes","no"}  Depression Screen done today and results listed below:     06/06/2021    9:01 AM 04/05/2021    8:43 AM  Depression screen PHQ 2/9  Decreased Interest 3 0  Down, Depressed, Hopeless 0 0  PHQ - 2 Score 3 0  Altered sleeping 3 0  Tired, decreased energy 3 0  Change in appetite 0 0  Feeling bad or failure about yourself  0 0  Trouble concentrating 0 0  Moving slowly or fidgety/restless 0 0  Suicidal thoughts 0 0  PHQ-9 Score 9 0  Difficult doing work/chores  Not difficult at all    The patient {has/does not BMWU:13244} a history of falls. I {did/did not:19850} complete a risk assessment for falls. A plan of care for falls {was/was not:19852} documented.   Past Medical History:  Past Medical History:  Diagnosis Date   Medical history non-contributory    Ovarian cyst     Surgical History:  Past Surgical History:  Procedure Laterality Date   SPINE SURGERY     WISDOM TOOTH EXTRACTION      Medications:  Current Outpatient Medications on File Prior to Visit  Medication Sig   ASPIRIN LOW DOSE 81 MG EC tablet Take 81 mg by mouth daily.   clotrimazole-betamethasone (LOTRISONE) cream Apply 1 application topically 2 (two) times daily.   lidocaine (XYLOCAINE) 2 % solution Use as directed 15 mLs in the mouth or throat every 6 (six) hours as needed for mouth pain. Swish, gargle and spit, do not swallow   ondansetron (ZOFRAN ODT) 4 MG disintegrating tablet Take 1 tablet (4 mg total) by mouth every 8 (eight) hours as needed for nausea.   Prenatal Vit-Fe Fumarate-FA  (MULTIVITAMIN-PRENATAL) 27-0.8 MG TABS tablet Take 1 tablet by mouth daily at 12 noon.   No current facility-administered medications on file prior to visit.    Allergies:  No Known Allergies  Social History:  Social History   Socioeconomic History   Marital status: Single    Spouse name: Not on file   Number of children: Not on file   Years of education: Not on file   Highest education level: Associate degree: occupational, Scientist, product/process development, or vocational program  Occupational History   Not on file  Tobacco Use   Smoking status: Never   Smokeless tobacco: Never  Vaping Use   Vaping status: Former   Substances: Nicotine, Flavoring  Substance and Sexual Activity   Alcohol use: Never   Drug use: Never   Sexual activity: Yes  Other Topics Concern   Not on file  Social History Narrative   Not on file   Social Determinants of Health   Financial Resource Strain: Low Risk  (05/23/2023)   Overall Financial Resource Strain (CARDIA)    Difficulty of Paying Living Expenses: Not hard at all  Food Insecurity: No Food Insecurity (05/23/2023)   Hunger Vital Sign    Worried About Running Out of Food in the Last Year: Never true    Ran Out of Food in the Last Year: Never true  Transportation Needs: No Transportation Needs (05/23/2023)   PRAPARE - Transportation  Lack of Transportation (Medical): No    Lack of Transportation (Non-Medical): No  Physical Activity: Sufficiently Active (05/23/2023)   Exercise Vital Sign    Days of Exercise per Week: 5 days    Minutes of Exercise per Session: 30 min  Stress: Stress Concern Present (05/23/2023)   [employer] of Occupational Health - Occupational Stress Questionnaire    Feeling of Stress : To some extent  Social Connections: Moderately Isolated (05/23/2023)   Social Connection and Isolation Panel [NHANES]    Frequency of Communication with Friends and Family: More than three times a week    Frequency of Social Gatherings with Friends  and Family: More than three times a week    Attends Religious Services: More than 4 times per year    Active Member of [employer] or Organizations: No    Attends Engineer, structural: Not on file    Marital Status: Never married  Catering manager Violence: Not on file   Social History   Tobacco Use  Smoking Status Never  Smokeless Tobacco Never   Social History   Substance and Sexual Activity  Alcohol Use Never    Family History:  Family History  Problem Relation Age of Onset   Hypertension Mother    Breast cancer Maternal Grandmother 11    Past medical history, surgical history, medications, allergies, family history and social history reviewed with patient today and changes made to appropriate areas of the chart.   ROS All other ROS negative except what is listed above and in the HPI.      Objective:    There were no vitals taken for this visit.  Wt Readings from Last 3 Encounters:  06/30/21 236 lb (107 kg)  06/06/21 235 lb 3.2 oz (106.7 kg)  05/18/21 235 lb (106.6 kg)    Physical Exam  Results for orders placed or performed in visit on 06/30/21  Rapid Strep screen([facility])   Specimen: Other   Other  Result Value Ref Range   Strep Gp A Ag, IA W/Reflex Negative Negative  Culture, Group A Strep   Other  Result Value Ref Range   Strep A Culture Negative   Novel Coronavirus, NAA ([facility])   Specimen: Nasopharyngeal(NP) swabs in vial transport medium  Result Value Ref Range   SARS-CoV-2, NAA Not Detected Not Detected  Veritor Flu A/B Waived  Result Value Ref Range   Influenza A Negative Negative   Influenza B Negative Negative      Assessment & Plan:   Problem List Items Addressed This Visit   None    Follow up plan: No follow-ups on file.   LABORATORY TESTING:  - Pap smear: {Blank single:19197::"pap done","not applicable","up to date","done elsewhere"}  IMMUNIZATIONS:   - Tdap: Tetanus vaccination status reviewed: {tetanus  status:315746}. - Influenza: {Blank single:19197::"Up to date","Administered today","Postponed to flu season","Refused","Given elsewhere"} - Pneumovax: {Blank single:19197::"Up to date","Administered today","Not applicable","Refused","Given elsewhere"} - Prevnar: {Blank single:19197::"Up to date","Administered today","Not applicable","Refused","Given elsewhere"} - COVID: {Blank single:19197::"Up to date","Administered today","Not applicable","Refused","Given elsewhere"} - HPV: {Blank single:19197::"Up to date","Administered today","Not applicable","Refused","Given elsewhere"} - Shingrix vaccine: {Blank single:19197::"Up to date","Administered today","Not applicable","Refused","Given elsewhere"}  SCREENING: -Mammogram: {Blank single:19197::"Up to date","Ordered today","Not applicable","Refused","Done elsewhere"}  - Colonoscopy: {Blank single:19197::"Up to date","Ordered today","Not applicable","Refused","Done elsewhere"}  - Bone Density: {Blank single:19197::"Up to date","Ordered today","Not applicable","Refused","Done elsewhere"}  -Hearing Test: {Blank single:19197::"Up to date","Ordered today","Not applicable","Refused","Done elsewhere"}  -Spirometry: {Blank single:19197::"Up to date","Ordered today","Not applicable","Refused","Done elsewhere"}   PATIENT COUNSELING:   Advised to take 1 mg of folate supplement per  day if capable of pregnancy.   Sexuality: Discussed sexually transmitted diseases, partner selection, use of condoms, avoidance of unintended pregnancy  and contraceptive alternatives.   Advised to avoid cigarette smoking.  I discussed with the patient that most people either abstain from alcohol or drink within safe limits (<=14/week and <=4 drinks/occasion for males, <=7/weeks and <= 3 drinks/occasion for females) and that the risk for alcohol disorders and other health effects rises proportionally with the number of drinks per week and how often a drinker exceeds daily  limits.  Discussed cessation/primary prevention of drug use and availability of treatment for abuse.   Diet: Encouraged to adjust caloric intake to maintain  or achieve ideal body weight, to reduce intake of dietary saturated fat and total fat, to limit sodium intake by avoiding high sodium foods and not adding table salt, and to maintain adequate dietary potassium and calcium preferably from fresh fruits, vegetables, and low-fat dairy products.    stressed the importance of regular exercise  Injury prevention: Discussed safety belts, safety helmets, smoke detector, smoking near bedding or upholstery.   Dental health: Discussed importance of regular tooth brushing, flossing, and dental visits.    NEXT PREVENTATIVE PHYSICAL DUE IN 1 YEAR. No follow-ups on file.

## 2023-05-29 ENCOUNTER — Encounter: Payer: Self-pay | Admitting: Nurse Practitioner

## 2023-05-29 ENCOUNTER — Ambulatory Visit (INDEPENDENT_AMBULATORY_CARE_PROVIDER_SITE_OTHER): Payer: Medicaid Other | Admitting: Nurse Practitioner

## 2023-05-29 VITALS — BP 115/71 | HR 90 | Temp 98.1°F | Ht 66.0 in | Wt 211.4 lb

## 2023-05-29 DIAGNOSIS — Z136 Encounter for screening for cardiovascular disorders: Secondary | ICD-10-CM

## 2023-05-29 DIAGNOSIS — Z0000 Encounter for general adult medical examination without abnormal findings: Secondary | ICD-10-CM

## 2023-05-29 DIAGNOSIS — Z23 Encounter for immunization: Secondary | ICD-10-CM

## 2023-05-29 DIAGNOSIS — N9089 Other specified noninflammatory disorders of vulva and perineum: Secondary | ICD-10-CM | POA: Diagnosis not present

## 2023-05-29 LAB — MICROSCOPIC EXAMINATION

## 2023-05-29 LAB — URINALYSIS, ROUTINE W REFLEX MICROSCOPIC
Bilirubin, UA: NEGATIVE
Glucose, UA: NEGATIVE
Ketones, UA: NEGATIVE
Nitrite, UA: NEGATIVE
RBC, UA: NEGATIVE
Specific Gravity, UA: 1.02 (ref 1.005–1.030)
Urobilinogen, Ur: 1 mg/dL (ref 0.2–1.0)
pH, UA: 7 (ref 5.0–7.5)

## 2023-05-30 LAB — CBC WITH DIFFERENTIAL/PLATELET
Basophils Absolute: 0 10*3/uL (ref 0.0–0.2)
Basos: 1 %
EOS (ABSOLUTE): 0.2 10*3/uL (ref 0.0–0.4)
Eos: 2 %
Hematocrit: 40.2 % (ref 34.0–46.6)
Hemoglobin: 12.7 g/dL (ref 11.1–15.9)
Immature Grans (Abs): 0.1 10*3/uL (ref 0.0–0.1)
Immature Granulocytes: 1 %
Lymphocytes Absolute: 2.2 10*3/uL (ref 0.7–3.1)
Lymphs: 26 %
MCH: 27.4 pg (ref 26.6–33.0)
MCHC: 31.6 g/dL (ref 31.5–35.7)
MCV: 87 fL (ref 79–97)
Monocytes Absolute: 0.6 10*3/uL (ref 0.1–0.9)
Monocytes: 8 %
Neutrophils Absolute: 5.1 10*3/uL (ref 1.4–7.0)
Neutrophils: 62 %
Platelets: 288 10*3/uL (ref 150–450)
RBC: 4.63 x10E6/uL (ref 3.77–5.28)
RDW: 12.1 % (ref 11.7–15.4)
WBC: 8.2 10*3/uL (ref 3.4–10.8)

## 2023-05-30 LAB — COMPREHENSIVE METABOLIC PANEL
ALT: 13 [IU]/L (ref 0–32)
AST: 12 [IU]/L (ref 0–40)
Albumin: 4.2 g/dL (ref 4.0–5.0)
Alkaline Phosphatase: 78 [IU]/L (ref 44–121)
BUN/Creatinine Ratio: 7 — ABNORMAL LOW (ref 9–23)
BUN: 5 mg/dL — ABNORMAL LOW (ref 6–20)
Bilirubin Total: 0.7 mg/dL (ref 0.0–1.2)
CO2: 26 mmol/L (ref 20–29)
Calcium: 9.3 mg/dL (ref 8.7–10.2)
Chloride: 103 mmol/L (ref 96–106)
Creatinine, Ser: 0.75 mg/dL (ref 0.57–1.00)
Globulin, Total: 3 g/dL (ref 1.5–4.5)
Glucose: 83 mg/dL (ref 70–99)
Potassium: 3.6 mmol/L (ref 3.5–5.2)
Sodium: 141 mmol/L (ref 134–144)
Total Protein: 7.2 g/dL (ref 6.0–8.5)
eGFR: 114 mL/min/{1.73_m2} (ref >59)

## 2023-05-30 LAB — LIPID PANEL
Chol/HDL Ratio: 2.3 ratio (ref 0.0–4.4)
Cholesterol, Total: 138 mg/dL (ref 100–199)
HDL: 60 mg/dL (ref >39)
LDL Chol Calc (NIH): 59 mg/dL (ref 0–99)
Triglycerides: 103 mg/dL (ref 0–149)
VLDL Cholesterol Cal: 19 mg/dL (ref 5–40)

## 2023-05-30 LAB — TSH: TSH: 0.98 u[IU]/mL (ref 0.450–4.500)

## 2023-06-24 DIAGNOSIS — Z419 Encounter for procedure for purposes other than remedying health state, unspecified: Secondary | ICD-10-CM | POA: Diagnosis not present

## 2023-07-25 DIAGNOSIS — Z419 Encounter for procedure for purposes other than remedying health state, unspecified: Secondary | ICD-10-CM | POA: Diagnosis not present

## 2023-07-30 ENCOUNTER — Ambulatory Visit: Payer: Medicaid Other

## 2023-08-25 DIAGNOSIS — Z419 Encounter for procedure for purposes other than remedying health state, unspecified: Secondary | ICD-10-CM | POA: Diagnosis not present

## 2023-08-31 IMAGING — US US OB COMP +14 WK
1 series · 15 of 28 positions shown · non-contrast
Comparison: none

CLINICAL DATA: Second trimester pregnancy for fetal anatomy survey.

EXAM:
OBSTETRICAL ULTRASOUND >14 WKS

[Series 1: us ob comp + 14 wk · 15 of 74 slices shown]
[im 1/74]
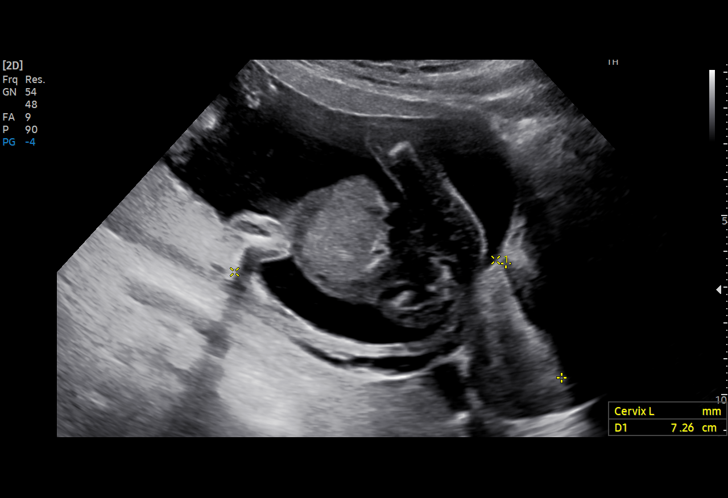
[im 6/74]
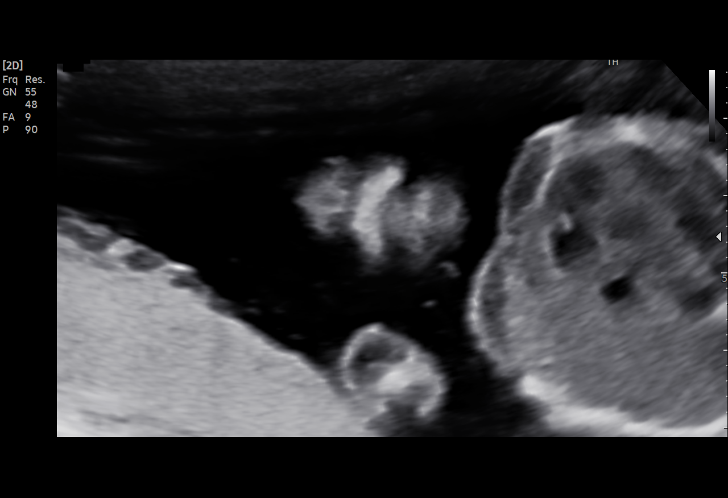
[im 11/74]
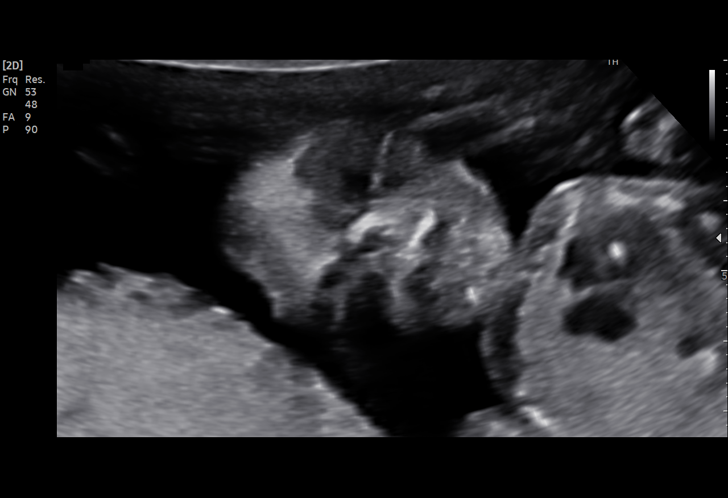
[im 17/74]
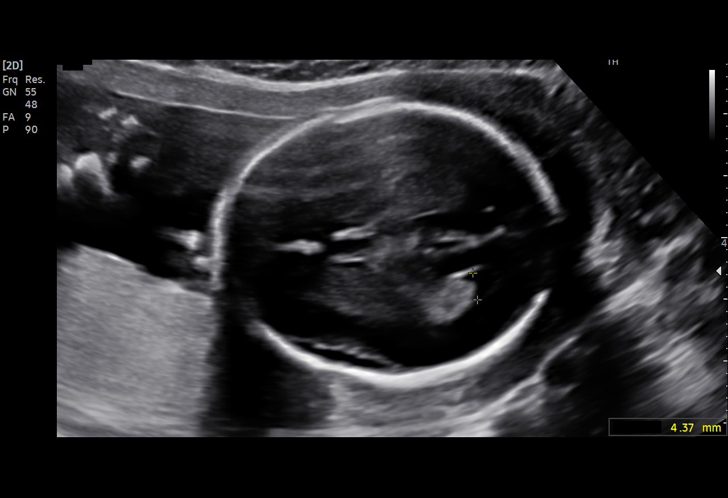
[im 22/74]
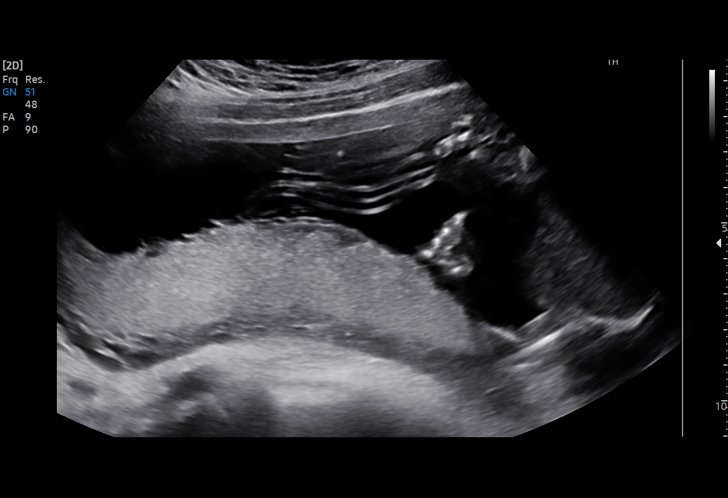
[im 28/74]
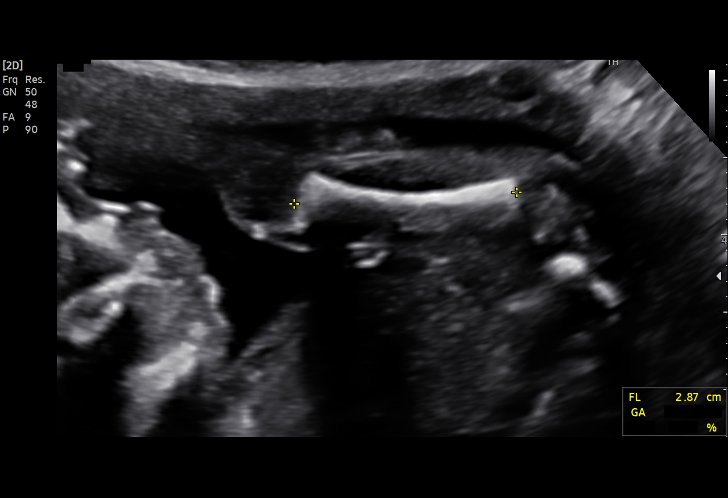
[im 33/74]
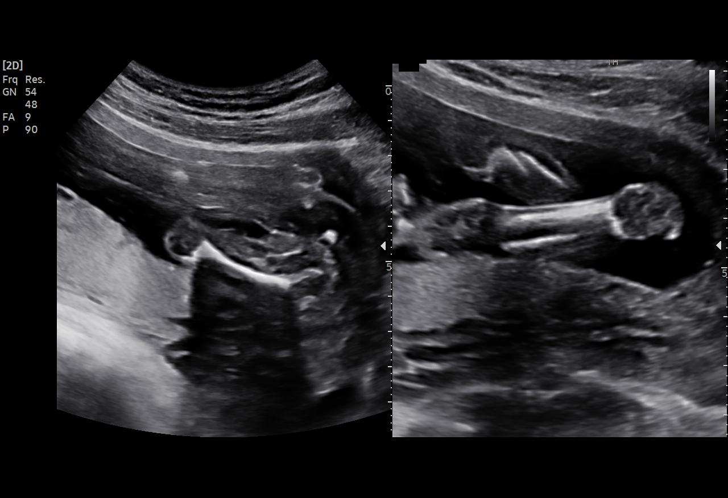
[im 38/74]
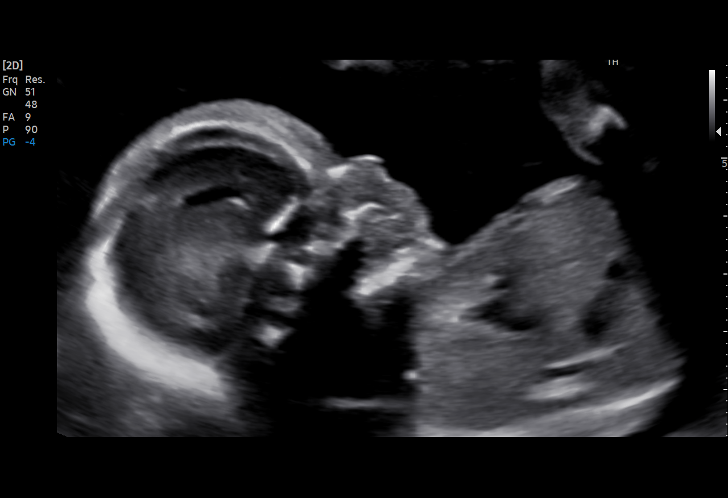
[im 41/74]
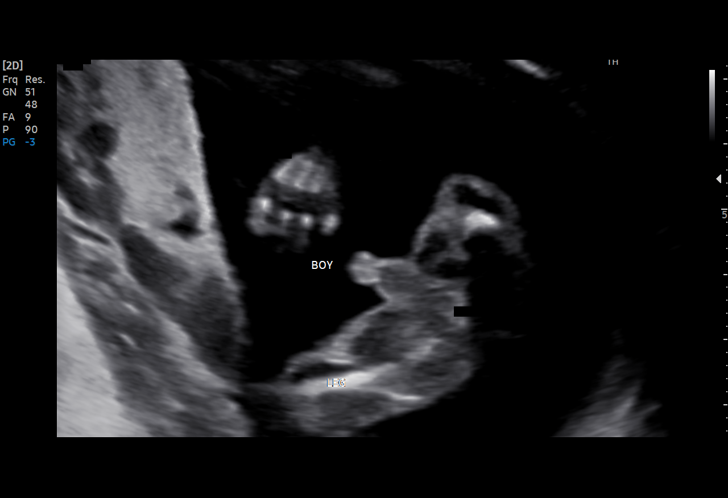
[im 46/74]
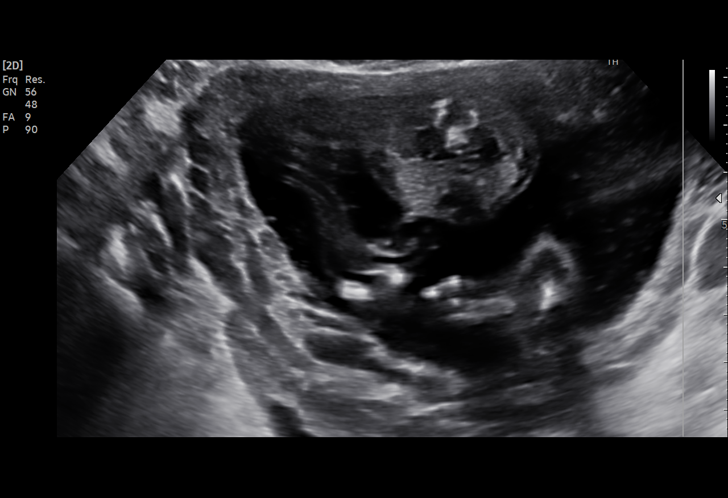
[im 52/74]
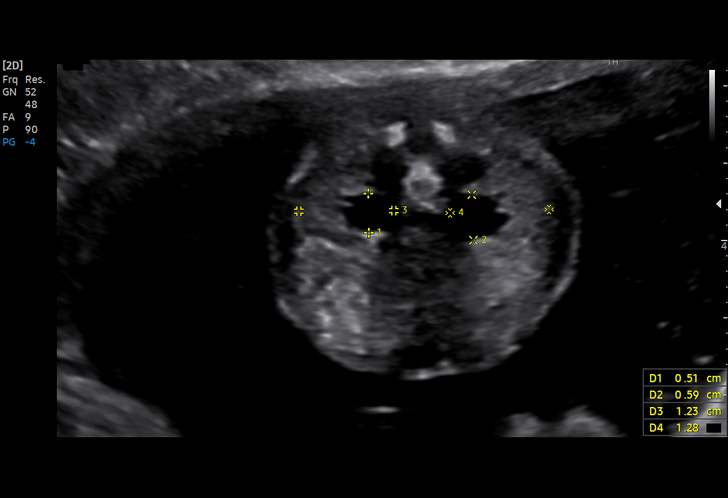
[im 57/74]
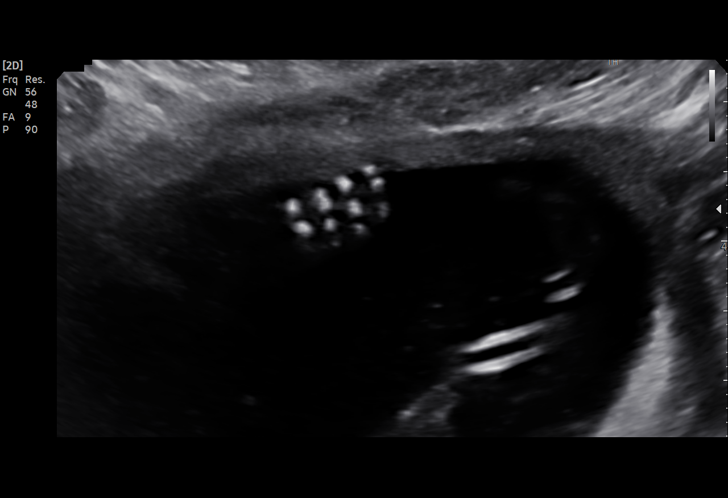
[im 63/74]
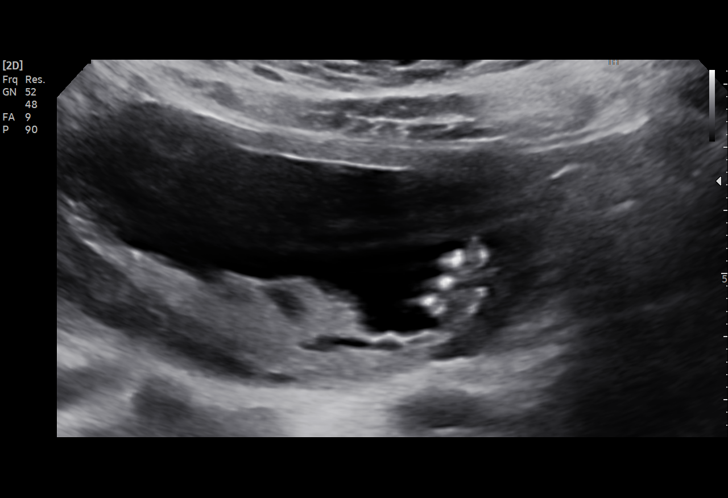
[im 68/74]
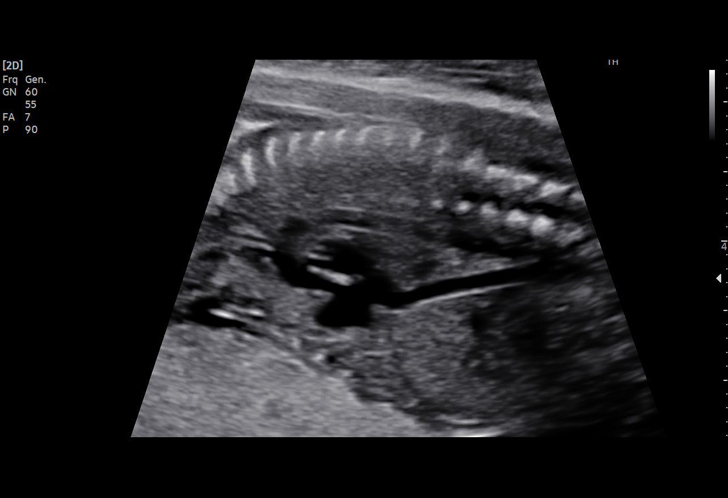
[im 74/74]
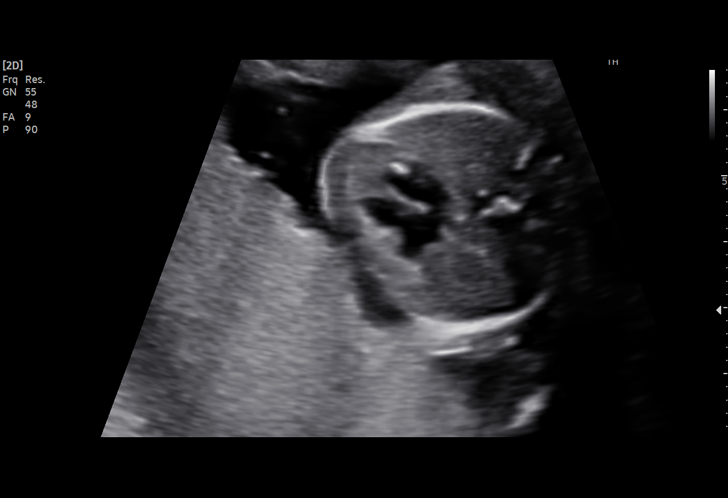

[15 of 28 positions shown; findings below may reference images not displayed]

FINDINGS: Number of Fetuses: 1

Heart Rate:  141 bpm

Movement: Yes

Presentation: Breech

Previa: No

Placental Location: Posterior

Amniotic Fluid (Subjective): Within normal limits

Amniotic Fluid (Objective):

Vertical pocket = 5.4cm

FETAL BIOMETRY

BPD: 4.5cm 19w 5d

HC:   16.5cm 19w 2d

AC:   14.7cm 20w 0d

FL:   2.9cm 18w 5d

Current Mean GA: 19w 3d US EDC: 09/09/2021

FETAL ANATOMY

Lateral Ventricles: Appears normal

Thalami/CSP: Appears normal

Posterior Fossa:  Appears normal

Nuchal Region: Appears normal   NFT= 3.8 mm

Upper Lip: Appears normal

Spine: Appears normal

4 Chamber Heart on Left: Not well visualized, but echogenic
intracardiac focus is noted the left ventricle

LVOT: Not visualized

RVOT: Not visualized

Stomach on Left: Appears normal

3 Vessel Cord: Appears normal

Cord Insertion site: Appears normal

Kidneys: Bilateral renal pyelectasis measuring 5 mm on the left and
6 mm in right

Bladder: Appears normal

Extremities: Appears normal

Sex: Male

Technically difficult due to: Fetal position

Maternal Findings:

Cervix:  3.5 cm TA

A simple appearing cyst is seen in the right adnexa measuring 5.3 x
3.3 x 3.6 cm.
IMPRESSION: Single living IUP with estimated gestational age of 19 weeks 3 days,
and US EDC of 09/09/2021.

Four-chamber heart and cardiac outflow tracts were not visualized.
Echogenic intracardiac focus and bilateral fetal renal pyelectasis
are noted, which are soft sonographic markers for aneuploidy.
Recommend correlation with prior aneuploidy screening test results
if performed, and follow-up ultrasound in 3 weeks. Consider referral
for MFM consultation/genetic counseling.

5.3 cm benign-appearing cyst in right adnexa. Recommend continued
attention on follow-up ultrasound.

## 2023-09-22 DIAGNOSIS — Z419 Encounter for procedure for purposes other than remedying health state, unspecified: Secondary | ICD-10-CM | POA: Diagnosis not present

## 2023-09-23 IMAGING — US US MFM OB DETAIL+14 WK
1 series · 12 of 28 positions shown · non-contrast
Comparison: none

[Series 1: us mfm ob detail+14 wk · 12 of 137 slices shown]
[im 6/137]
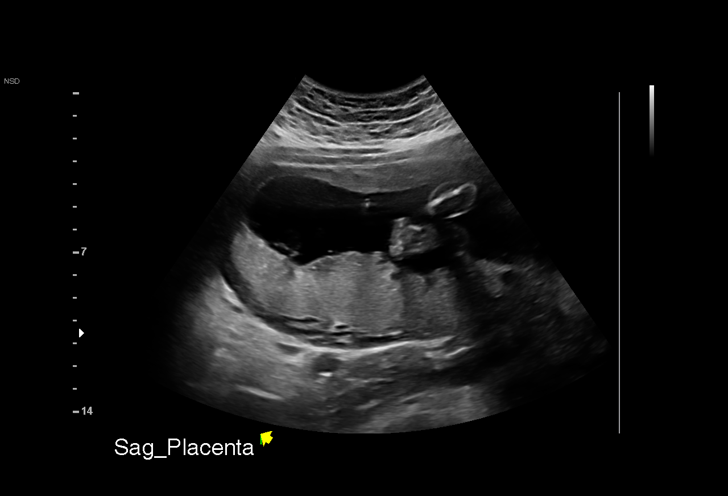
[im 16/137]
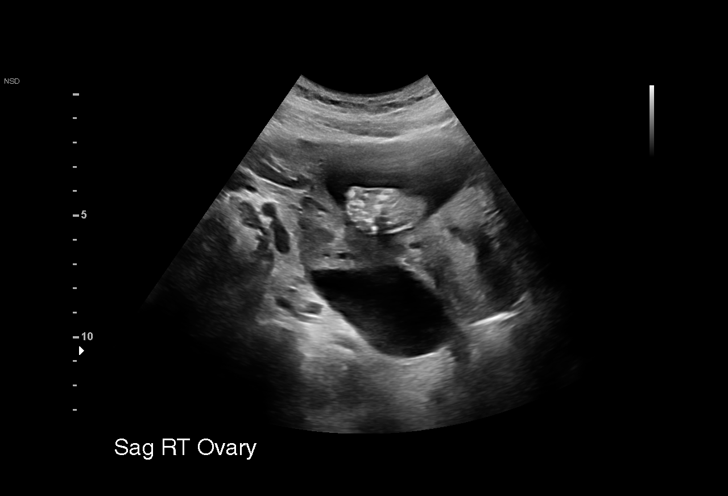
[im 26/137]
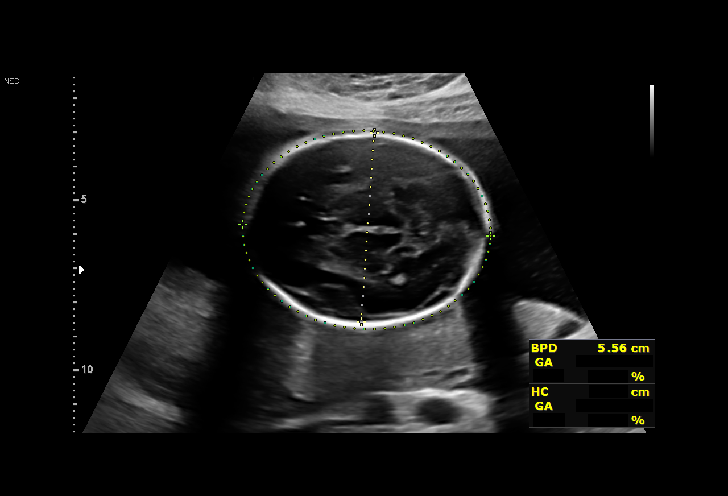
[im 41/137]
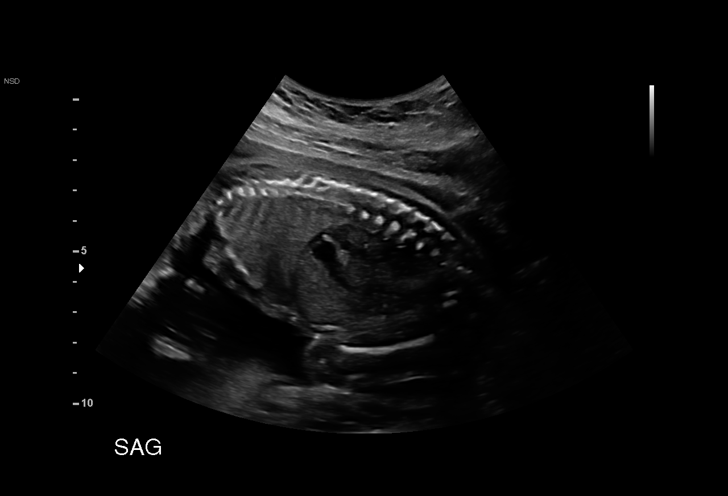
[im 51/137]
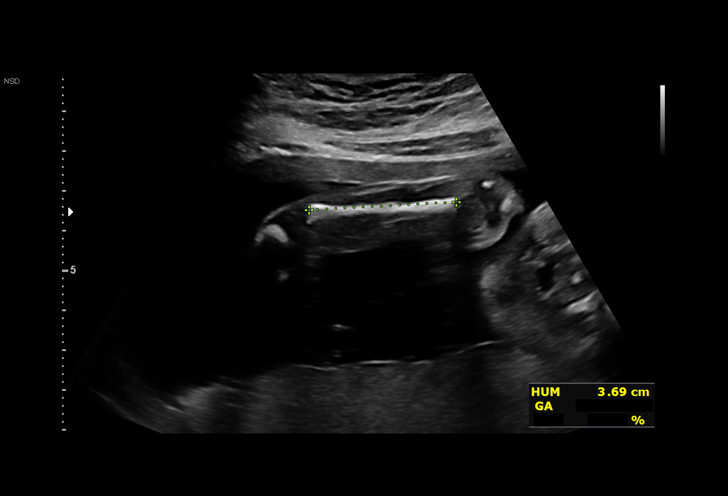
[im 61/137]
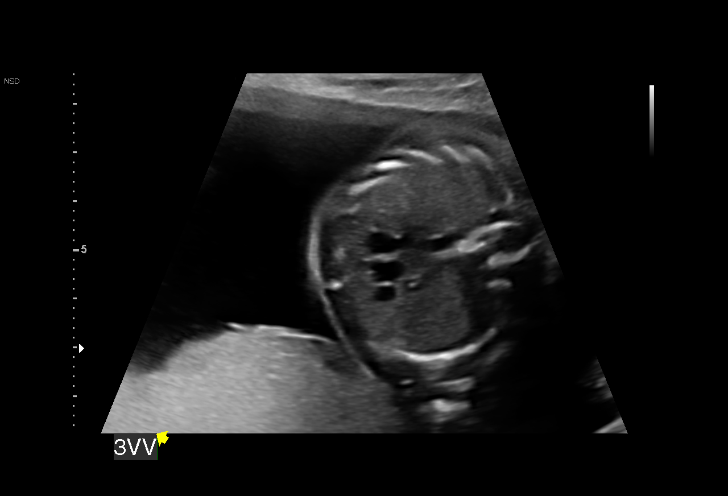
[im 76/137]
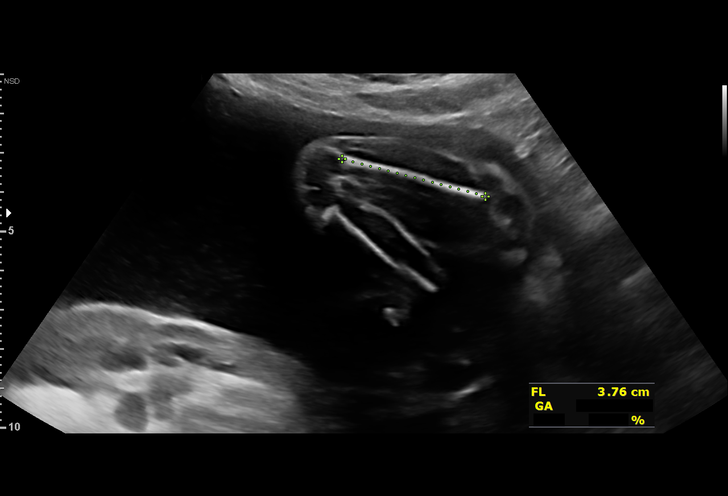
[im 86/137]
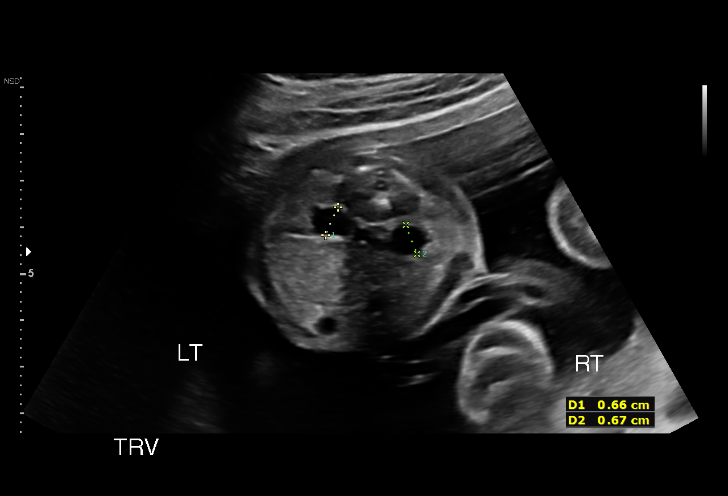
[im 96/137]
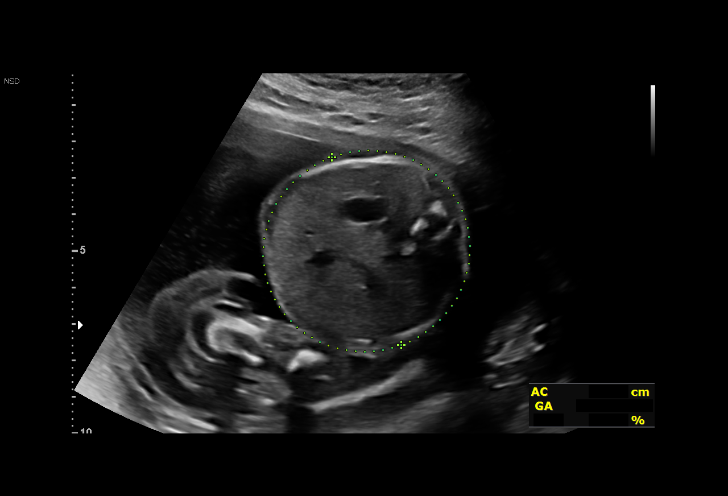
[im 111/137]
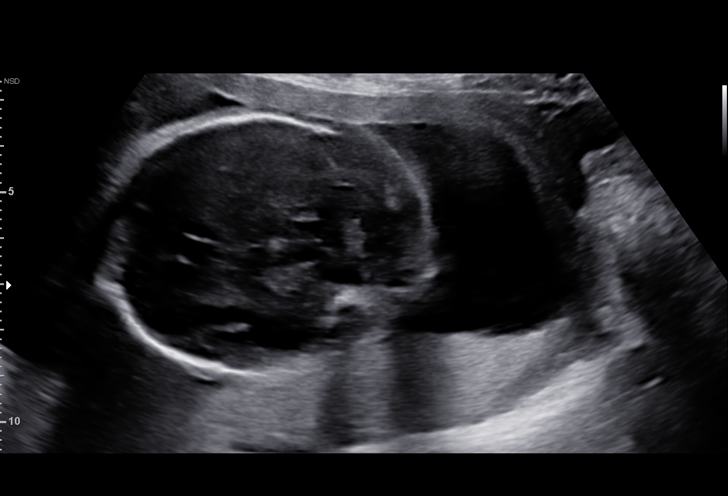
[im 121/137]
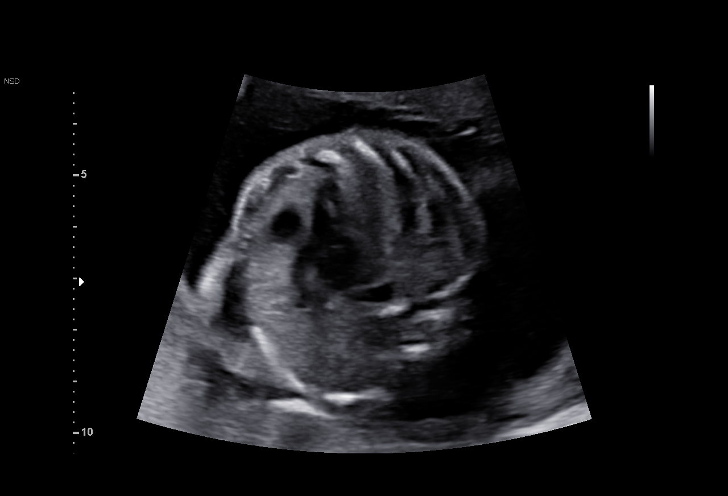
[im 131/137]
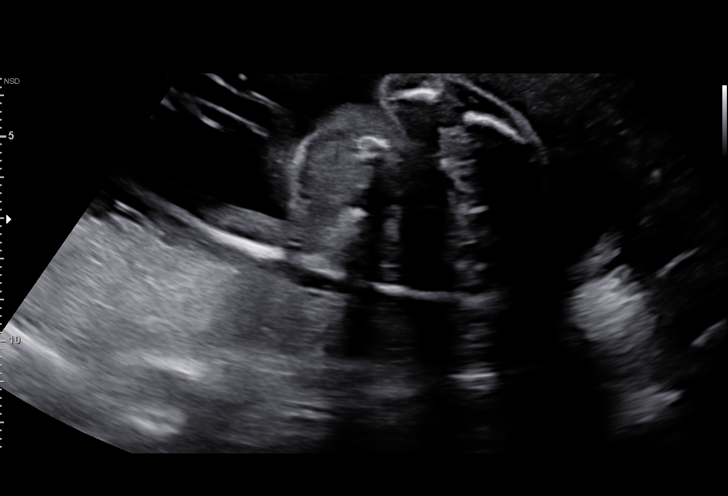

[12 of 28 positions shown; findings below may reference images not displayed]

Indications

 Obesity complicating pregnancy, second
 trimester
 Fetal abnormality - other known or
 suspected (pylectasis)
 22 weeks gestation of pregnancy
 LOW risk NIPS
 Genetic carrier (? Beta thalassemia minor)
Fetal Evaluation

 Num Of Fetuses:         1
 Fetal Heart Rate(bpm):  123
 Cardiac Activity:       Observed
 Presentation:           Breech
 Placenta:               Posterior
 P. Cord Insertion:      Visualized

 Amniotic Fluid
 AFI FV:      Within normal limits

                             Largest Pocket(cm)

Biometry

 BPD:      56.2  mm     G. Age:  23w 1d         63  %    CI:        72.88   %    70 - 86
                                                         FL/HC:      18.0   %    19.2 -
 HC:      209.3  mm     G. Age:  23w 0d         48  %    HC/AC:      1.15        1.05 -
 AC:      182.1  mm     G. Age:  23w 0d         53  %    FL/BPD:     67.1   %    71 - 87
 FL:       37.7  mm     G. Age:  22w 0d         19  %    FL/AC:      20.7   %    20 - 24
 HUM:      36.5  mm     G. Age:  22w 5d         44  %
 CER:      25.1  mm     G. Age:  22w 6d         82  %

 LV:        5.7  mm
 CM:        5.5  mm
 Est. FW:     524  gm      1 lb 2 oz     40  %
OB History

 Gravidity:    1         Term:   0        Prem:   0        SAB:   0
 TOP:          0       Ectopic:  0        Living: 0
Gestational Age

 LMP:           22w 5d        Date:  12/03/20                 EDD:   09/09/21
 U/S Today:     22w 6d                                        EDD:   09/08/21
 Best:          22w 5d     Det. By:  LMP  (12/03/20)          EDD:   09/09/21
Anatomy

 Cranium:               Appears normal         Aortic Arch:            Appears normal
 Cavum:                 Appears normal         Ductal Arch:            Not well visualized
 Ventricles:            Appears normal         Diaphragm:              Appears normal
 Cerebellum:            Appears normal         Stomach:                Appears normal, left
                                                                       sided
 Posterior Fossa:       Appears normal         Abdomen:                Appears normal
 Nuchal Fold:           Appears normal         Abdominal Wall:         Appears nml (cord
                                                                       insert, abd wall)
 Face:                  Appears normal         Cord Vessels:           Appears normal (3
                        (orbits and profile)                           vessel cord)
 Lips:                  Appears normal         Kidneys:                Bilat pyelectasis,
                                                                       Rt 6mm, Lt 7mm
 Palate:                Not well visualized    Bladder:                Appears normal
 Thoracic:              Appears normal         Spine:                  Appears normal
 Heart:                 Appears normal; EIF    Upper Extremities:      Appears normal
 RVOT:                  Not well visualized    Lower Extremities:      Appears normal
 LVOT:                  Not well visualized

 Other:  Heels and 5th digit visualized. Open hands visualized. Nasal bone
         visualized. VC, and 3VV visualized. Male gender.
Cervix Uterus Adnexa

 Cervix
 Length:           4.09  cm.
 Normal appearance by transabdominal scan.

 Right Ovary
 Simple cyst, measuring 6.4 x 3.7 x 3.6cm.

 Left Ovary
 Within normal limits.

 Adnexa
 No abnormality visualized.
Comments

 This patient was seen for a detailed fetal anatomy scan due
 to maternal obesity.  An echogenic focus and pyelectasis
 were noted on the recent ultrasound performed in your office.
 She denies any significant past medical history and denies
 any problems in her current pregnancy.
 She had a cell free DNA test earlier in her pregnancy which
 indicated a low risk for trisomy 21, 18, and 13. A male fetus is
 predicted.
 She was informed that the fetal growth and amniotic fluid
 level were appropriate for her gestational age.
 On today's exam, an intracardiac echogenic focus were noted
 in the left and right ventricles of the fetal heart.  The small
 association between an echogenic focus and Down
 syndrome was discussed.
 Bilateral pyelectasis measuring 0.6 to 0.7 cm dilated was
 noted on today's ultrasound exam.  The implications and
 management of pylectasiswas discussed with the patient.
 She was advised that the pyelectasis noted on prenatal
 ultrasounds will often resolve spontaneously after birth.
 However, there may also be other processes such as an
 obstruction or reflux causing this finding that may require
 treatment after birth.  She was advised that we will continue
 to follow her closely to assess this finding.  Should the renal
 pelvis continue to enlarge, we will consider a referral to
 pediatric urology/nephrology for a prenatal consultation.
 The small association between pyelectasis and Down
 syndrome was discussed.
 Due to the echogenic focus and mild bilateral pyelectasis
 noted today, the patient was offered and declined an
 amniocentesis today for definitive diagnosis of fetal
 aneuploidy.  She reports that she is comfortable with her
 negative cell free DNA test.
 The patient was informed that anomalies may be missed due
 to technical limitations. If the fetus is in a suboptimal position
 or maternal habitus is increased, visualization of the fetus in
 the maternal uterus may be impaired.
 The patient also screened positive as a possible carrier for
 beta thalassemia.  She met with our genetic counselor
 following today's exam to discuss the significance of being a
 possible carrier for beta thalassemia.  She is aware that an
 amniocentesis is available for definitive diagnosis of beta
 thalassemia.
 A follow-up exam was scheduled in 4 weeks to complete the
 views of the fetal anatomy which were limited today due to
 the fetal position.

## 2023-11-03 DIAGNOSIS — Z419 Encounter for procedure for purposes other than remedying health state, unspecified: Secondary | ICD-10-CM | POA: Diagnosis not present

## 2023-12-24 ENCOUNTER — Other Ambulatory Visit: Payer: Self-pay

## 2023-12-24 ENCOUNTER — Emergency Department
Admission: EM | Admit: 2023-12-24 | Discharge: 2023-12-24 | Disposition: A | Discharge status: Home / Self Care | Payer: Self-pay | Attending: Emergency Medicine | Admitting: Emergency Medicine

## 2023-12-24 DIAGNOSIS — L0291 Cutaneous abscess, unspecified: Secondary | ICD-10-CM

## 2023-12-24 DIAGNOSIS — L0231 Cutaneous abscess of buttock: Secondary | ICD-10-CM | POA: Insufficient documentation

## 2023-12-24 MED ORDER — LIDOCAINE HCL (PF) 1 % IJ SOLN
5.0000 mL | Freq: Once | INTRAMUSCULAR | Status: AC
  Administered 2023-12-24: 5 mL
  Filled 2023-12-24: qty 5

## 2023-12-24 MED ORDER — DOXYCYCLINE HYCLATE 100 MG PO TABS: 100.0000 mg | ORAL_TABLET | Freq: Two times a day (BID) | ORAL | 0 refills | Status: AC

## 2023-12-24 NOTE — Discharge Instructions (Addendum)
 You have been diagnosed with abscess in the right gluteal area.  You can change the dressing tomorrow.  Please take doxycycline 1 tablet every 12 hours after main meals for 10 days.  Please come back to ED or go to your PCP if you have new symptoms or symptoms worsen

## 2023-12-24 NOTE — ED Triage Notes (Signed)
 Pt to ED via POV from home. Pt reports bump on right thigh since Sunday. Pt was prescribed antibiotic by UC. Pt reports feels like it is not getting better. Pt reports bump has gotten bigger. Unsure of insect bite.

## 2023-12-24 NOTE — ED Provider Notes (Signed)
 [facility] Provider Note    Event Date/Time   First MD Initiated Contact with Patient 12/24/23 1826     (approximate)   History   Cellulitis    HPI  Tiffany Booth is a 28 y.o. female    with a past medical history of cellulitis, pharyngitis,, with no significant past medical history who presents to the ED complaining of bump on right gluteus. According to the patient, symptoms started a week ago, with erythema and tender area in the right external area of gluteus.  Patient with seeing at your urgent care they prescribed cephalexin and without resolution of the symptoms.  States lesion is getting bigger.  Patient denies fever, nauseas or vomit.       Physical Exam   Triage Vital Signs: ED Triage Vitals  Encounter Vitals Group     BP 12/24/23 1749 124/86     Systolic BP Percentile --      Diastolic BP Percentile --      Pulse Rate 12/24/23 1748 (!) 108     Resp 12/24/23 1748 20     Temp 12/24/23 1748 98.1 F (36.7 C)     Temp Source 12/24/23 1748 Oral     SpO2 12/24/23 1748 99 %     Weight --      Height --      Head Circumference --      Peak Flow --      Pain Score 12/24/23 1748 8     Pain Loc --      Pain Education --      Exclude from Growth Chart --     Most recent vital signs: Vitals:   12/24/23 1748 12/24/23 1749  BP:  124/86  Pulse: (!) 108   Resp: 20   Temp: 98.1 F (36.7 C)   SpO2: 99%      Constitutional: Alert, NAD. Able to speak in complete sentences without cough or dyspnea  Eyes: Conjunctivae are normal.  Head: Atraumatic. Nose: No congestion/rhinnorhea. Mouth/Throat: Mucous membranes are moist.   Neck: Painless ROM. Supple. No JVD, nodes, thyromegaly  Cardiovascular:   Good peripheral circulation.RRR no murmurs, gallops, rubs  Respiratory: Normal respiratory effort.  No retractions. Clear to auscultation bilaterally without wheezing or crackles  Gastrointestinal: Soft and nontender.  Musculoskeletal:  no  deformity Neurologic:  MAE spontaneously. No gross focal neurologic deficits are appreciated.  Skin:  Skin is warm, dry and intact.  Right gluteus: Presence of circular area about 15 cm, with elevated center, tender to palpation, warmth.  Psychiatric: Mood and affect are normal. Speech and behavior are normal.    ED Results / Procedures / Treatments   Labs (all labs ordered are listed, but only abnormal results are displayed) Labs Reviewed - No data to display   EKG   RADIOLOGY    PROCEDURES:  Critical Care performed:   .Incision and Drainage  Date/Time: 12/24/2023 8:28 PM  Performed by: Awilda Lennox, PA-C Authorized by: Awilda Lennox, PA-C   Consent:    Consent obtained:  Verbal   Consent given by:  Patient   Risks, benefits, and alternatives were discussed: yes     Risks discussed:  Incomplete drainage and pain Universal protocol:    Procedure explained and questions answered to patient or proxy's satisfaction: yes     Patient identity confirmed:  Verbally with patient Location:    Type:  Abscess   Location:  Lower extremity   Lower extremity location:  Buttock  Buttock location:  R buttock Pre-procedure details:    Skin preparation:  Povidone-iodine Sedation:    Sedation type:  None Anesthesia:    Anesthesia method:  Local infiltration   Local anesthetic:  Lidocaine  1% WITH epi Procedure type:    Complexity:  Simple Procedure details:    Ultrasound guidance: no     Needle aspiration: no     Incision types:  Stab incision   Incision depth:  Dermal   Drainage:  Bloody and purulent   Drainage amount:  Moderate   Wound treatment:  Wound left open    MEDICATIONS ORDERED IN ED: Medications  lidocaine  (PF) (XYLOCAINE ) 1 % injection 5 mL (has no administration in time range)      IMPRESSION / MDM / ASSESSMENT AND PLAN / ED COURSE  I reviewed the triage vital signs and the nursing notes.  Differential diagnosis includes, but is not limited to,  abscess, cellulitis, foreign body  Patient's presentation is most consistent with acute, uncomplicated illness.  Patient's diagnosis is consistent with abscess in gluteal area.  Physical exam is reassuring, I did not order any labs. I did drain the abscess.   I did review the patient's allergies and medications.The patient is in stable and satisfactory condition for discharge home.  Patient will be discharged home with prescriptions for doxycycline. Patient is to follow up with PCP  as needed or otherwise directed. Patient is given ED precautions to return to the ED for any worsening or new symptoms. Discussed plan of care with patient, answered all of patient's questions, Patient agreeable to plan of care. Advised patient to take medications according to the instructions on the label. Discussed possible side effects of new medications. Patient verbalized understanding.    FINAL CLINICAL IMPRESSION(S) / ED DIAGNOSES   Final diagnoses:  Abscess     Rx / DC Orders   ED Discharge Orders          Ordered    doxycycline (VIBRA-TABS) 100 MG tablet  2 times daily        12/24/23 2032             Note:  This document was prepared using Dragon voice recognition software and may include unintentional dictation errors.   Awilda Lennox, PA-C 12/24/23 2325    Claria Crofts, MD 12/25/23 1536

## 2024-01-28 ENCOUNTER — Ambulatory Visit: Payer: Self-pay

## 2024-02-11 ENCOUNTER — Other Ambulatory Visit: Payer: Self-pay

## 2024-02-11 ENCOUNTER — Emergency Department
Admission: EM | Admit: 2024-02-11 | Discharge: 2024-02-11 | Disposition: A | Discharge status: Home / Self Care | Payer: Self-pay | Attending: Emergency Medicine | Admitting: Emergency Medicine

## 2024-02-11 ENCOUNTER — Emergency Department: Payer: Self-pay

## 2024-02-11 DIAGNOSIS — M722 Plantar fascial fibromatosis: Secondary | ICD-10-CM | POA: Insufficient documentation

## 2024-02-11 MED ORDER — IBUPROFEN 600 MG PO TABS
600.0000 mg | ORAL_TABLET | Freq: Once | ORAL | Status: AC
  Administered 2024-02-11: 600 mg via ORAL
  Filled 2024-02-11: qty 1

## 2024-02-11 NOTE — ED Triage Notes (Signed)
 Pt to ED for L foot pain since Sat. Painful on plantar surface to bear weight. No injuries. No swelling noted.

## 2024-02-11 NOTE — Discharge Instructions (Addendum)
 You were seen in the emergency department for suspected plantar fasciitis.  You can use ibuprofen  at home based on the instructions on the bottle.  Please wear the postop shoe as needed.  Please consider purchasing heel supports to place in your shoes.  You can use a muscle roller and do foot and ankle stretches to help with your pain as well as ice and heat.  Please follow-up with your primary care provider; they can refer you to podiatry if the pain persists.

## 2024-02-11 NOTE — ED Provider Notes (Signed)
 [facility] Provider Note    Event Date/Time   First MD Initiated Contact with Patient 02/11/24 2042     (approximate)   History   Foot Pain   HPI {Remember to add pertinent medical, surgical, social, and/or OB history to HPI:1} Tiffany Booth is a 28 y.o. female  with a past medical history of *** presents to the emergency department with ***    Left foot medial and heel pain since Saturday [employer] Non slip shoes Plantar fasciitis recommendations  Physical Exam   Triage Vital Signs: ED Triage Vitals  Encounter Vitals Group     BP 02/11/24 1830 108/80     Girls Systolic BP Percentile --      Girls Diastolic BP Percentile --      Boys Systolic BP Percentile --      Boys Diastolic BP Percentile --      Pulse Rate 02/11/24 1830 100     Resp 02/11/24 1830 17     Temp 02/11/24 1830 98.6 F (37 C)     Temp Source 02/11/24 1830 Oral     SpO2 02/11/24 1830 100 %     Weight 02/11/24 1837 208 lb (94.3 kg)     Height 02/11/24 1837 5' 6 (1.676 m)     Head Circumference --      Peak Flow --      Pain Score --      Pain Loc --      Pain Education --      Exclude from Growth Chart --     Most recent vital signs: Vitals:   02/11/24 1830  BP: 108/80  Pulse: 100  Resp: 17  Temp: 98.6 F (37 C)  SpO2: 100%    General: Awake, in no acute distress. Appears stated age. Head: Normocephalic, atraumatic. Eyes: PERRLA. EOMs intact. No scleral icterus or conjunctival injection. Ears/Nose/Throat: TMs intact b/l. Nares patent, no nasal discharge. Oropharynx moist, no erythema or exudate. Dentition intact. Neck: Supple, no lymphadenopathy, no JVD, no nuchal rigidity. CV: Regular rate, ***. Peripheral pulses 2+ and symmetric. No edema. Respiratory: Breath sounds clear b/l. No wheezes, rales, or rhonchi. No respiratory distress. Normal respiratory effort. GI: Soft, non-distended, non-tender. No rebound or guarding.  MSK: Normal ROM and  ***  strength in *** extremities.  Skin:Warm, dry, intact. No rashes, lesions, or ecchymosis. No cyanosis or pallor. Neurological: A&Ox4 to person, place, time, and situation. Cranial nerves II-XII intact.*** Sensation intact. Strength symmetric. No focal deficits. Negative Brudzinski and Kernig signs. Psychiatric: Mood and affect appropriate. Thought processes coherent.   ED Results / Procedures / Treatments   Labs (all labs ordered are listed, but only abnormal results are displayed) Labs Reviewed - No data to display   EKG  ***   RADIOLOGY ***  I independently viewed the x-ray*** and radiologist's report.  I agree with the radiologist's report ***.   PROCEDURES:  Critical Care performed: {CriticalCareYesNo:19197::Yes, see critical care procedure note(s),No} Med Data Critical Care [email or web address] ***  Procedures   MEDICATIONS ORDERED IN ED: Medications - No data to display   IMPRESSION / MDM / ASSESSMENT AND PLAN / ED COURSE  I reviewed the triage vital signs and the nursing notes.                              Differential diagnosis includes, but is not limited to, ***  Patient's presentation is most  consistent with {EM COPA:27473}  *** {If the patient is on the monitor, remove the brackets and asterisks on the sentence below and remember to document it as a Procedure as well. Otherwise delete the sentence below:1} {**The patient is on the cardiac monitor to evaluate for evidence of arrhythmia and/or significant heart rate changes.**} {Remember to include, when applicable, any/all of the following data: independent review of imaging independent review of labs (comment specifically on pertinent positives and negatives) review of specific prior hospitalizations, PCP/specialist notes, etc. discuss meds given and prescribed document any discussion with consultants (including hospitalists) any clinical decision tools you used and why (PECARN, NEXUS, etc.) did you  consider admitting the patient? document social determinants of health affecting patient's care (homelessness, inability to follow up in a timely fashion, etc) document any pre-existing conditions increasing risk on current visit (e.g. diabetes and HTN increasing danger of high-risk chest pain/ACS) describes what meds you gave (especially parenteral) and why any other interventions?:1}     FINAL CLINICAL IMPRESSION(S) / ED DIAGNOSES   Final diagnoses:  None     Rx / DC Orders   ED Discharge Orders     None        Note:  This document was prepared using Dragon voice recognition software and may include unintentional dictation errors.

## 2024-05-29 ENCOUNTER — Encounter: Payer: Self-pay | Admitting: Nurse Practitioner

## 2024-05-29 NOTE — Progress Notes (Deleted)
 There were no vitals taken for this visit.   Subjective:    Patient ID: Tiffany Booth, female    DOB: [birth year], 28 y.o.   MRN: [id number]  HPI: Tiffany Booth is a 28 y.o. female presenting on 05/29/2024 for comprehensive medical examination. Current medical complaints include:{Blank single:19197::none,***}  She currently lives with: Menopausal Symptoms: {Blank single:19197::yes,no}  Depression Screen done today and results listed below:     05/29/2023    1:21 PM 06/06/2021    9:01 AM 04/05/2021    8:43 AM  Depression screen PHQ 2/9  Decreased Interest 3 3 0  Down, Depressed, Hopeless 0 0 0  PHQ - 2 Score 3 3 0  Altered sleeping 0 3 0  Tired, decreased energy 3 3 0  Change in appetite 0 0 0  Feeling bad or failure about yourself  0 0 0  Trouble concentrating 0 0 0  Moving slowly or fidgety/restless 0 0 0  Suicidal thoughts 0 0 0  PHQ-9 Score 6 9 0  Difficult doing work/chores   Not difficult at all    The patient {has/does not yjcz:80150} a history of falls. I {did/did not:19850} complete a risk assessment for falls. A plan of care for falls {was/was not:19852} documented.   Past Medical History:  Past Medical History:  Diagnosis Date   Medical history non-contributory    Ovarian cyst     Surgical History:  Past Surgical History:  Procedure Laterality Date   SPINE SURGERY     WISDOM TOOTH EXTRACTION      Medications:  No current outpatient medications on file prior to visit.   No current facility-administered medications on file prior to visit.    Allergies:  No Known Allergies  Social History:  Social History   Socioeconomic History   Marital status: Single    Spouse name: Not on file   Number of children: Not on file   Years of education: Not on file   Highest education level: Associate degree: occupational, scientist, product/process development, or vocational program  Occupational History   Not on file  Tobacco Use   Smoking status: Never   Smokeless  tobacco: Never  Vaping Use   Vaping status: Former   Substances: Nicotine, Flavoring  Substance and Sexual Activity   Alcohol use: Never   Drug use: Never   Sexual activity: Yes  Other Topics Concern   Not on file  Social History Narrative   Not on file   Social Drivers of Health   Financial Resource Strain: Low Risk  (05/23/2023)   Overall Financial Resource Strain (CARDIA)    Difficulty of Paying Living Expenses: Not hard at all  Food Insecurity: No Food Insecurity (05/23/2023)   Hunger Vital Sign    Worried About Running Out of Food in the Last Year: Never true    Ran Out of Food in the Last Year: Never true  Transportation Needs: No Transportation Needs (05/23/2023)   PRAPARE - Administrator, Civil Service (Medical): No    Lack of Transportation (Non-Medical): No  Physical Activity: Sufficiently Active (05/23/2023)   Exercise Vital Sign    Days of Exercise per Week: 5 days    Minutes of Exercise per Session: 30 min  Stress: Stress Concern Present (05/23/2023)   [employer] of Occupational Health - Occupational Stress Questionnaire    Feeling of Stress : To some extent  Social Connections: Moderately Isolated (05/23/2023)   Social Connection and Isolation Panel    Frequency  of Communication with Friends and Family: More than three times a week    Frequency of Social Gatherings with Friends and Family: More than three times a week    Attends Religious Services: More than 4 times per year    Active Member of [employer] or Organizations: No    Attends Engineer, Structural: Not on file    Marital Status: Never married  Catering Manager Violence: Not on file   Social History   Tobacco Use  Smoking Status Never  Smokeless Tobacco Never   Social History   Substance and Sexual Activity  Alcohol Use Never    Family History:  Family History  Problem Relation Age of Onset   Hypertension Mother    Breast cancer Maternal Grandmother 9    Past  medical history, surgical history, medications, allergies, family history and social history reviewed with patient today and changes made to appropriate areas of the chart.   ROS All other ROS negative except what is listed above and in the HPI.      Objective:    There were no vitals taken for this visit.  Wt Readings from Last 3 Encounters:  02/11/24 208 lb (94.3 kg)  05/29/23 211 lb 6.4 oz (95.9 kg)  06/30/21 236 lb (107 kg)    Physical Exam  Results for orders placed or performed in visit on 05/29/23  Microscopic Examination   Collection Time: 05/29/23  1:49 PM   Urine  Result Value Ref Range   WBC, UA 0-5 0 - 5 /hpf   RBC, Urine 0-2 0 - 2 /hpf   Epithelial Cells (non renal) 0-10 0 - 10 /hpf   Bacteria, UA Few None seen/Few  Urinalysis, Routine w reflex microscopic   Collection Time: 05/29/23  1:49 PM  Result Value Ref Range   Specific Gravity, UA 1.020 1.005 - 1.030   pH, UA 7.0 5.0 - 7.5   Color, UA Yellow Yellow   Appearance Ur Cloudy (A) Clear   Leukocytes,UA 1+ (A) Negative   Protein,UA Trace (A) Negative/Trace   Glucose, UA Negative Negative   Ketones, UA Negative Negative   RBC, UA Negative Negative   Bilirubin, UA Negative Negative   Urobilinogen, Ur 1.0 0.2 - 1.0 mg/dL   Nitrite, UA Negative Negative   Microscopic Examination See below:   CBC with Differential/Platelet   Collection Time: 05/29/23  1:50 PM  Result Value Ref Range   WBC 8.2 3.4 - 10.8 x10E3/uL   RBC 4.63 3.77 - 5.28 x10E6/uL   Hemoglobin 12.7 11.1 - 15.9 g/dL   Hematocrit 59.7 65.9 - 46.6 %   MCV 87 79 - 97 fL   MCH 27.4 26.6 - 33.0 pg   MCHC 31.6 31.5 - 35.7 g/dL   RDW 87.8 88.2 - 84.5 %   Platelets 288 150 - 450 x10E3/uL   Neutrophils 62 Not Estab. %   Lymphs 26 Not Estab. %   Monocytes 8 Not Estab. %   Eos 2 Not Estab. %   Basos 1 Not Estab. %   Neutrophils Absolute 5.1 1.4 - 7.0 x10E3/uL   Lymphocytes Absolute 2.2 0.7 - 3.1 x10E3/uL   Monocytes Absolute 0.6 0.1 - 0.9 x10E3/uL    EOS (ABSOLUTE) 0.2 0.0 - 0.4 x10E3/uL   Basophils Absolute 0.0 0.0 - 0.2 x10E3/uL   Immature Granulocytes 1 Not Estab. %   Immature Grans (Abs) 0.1 0.0 - 0.1 x10E3/uL  Comprehensive metabolic panel   Collection Time: 05/29/23  1:50 PM  Result Value Ref Range   Glucose 83 70 - 99 mg/dL   BUN 5 (L) 6 - 20 mg/dL   Creatinine, Ser 9.24 0.57 - 1.00 mg/dL   eGFR 885 >40 fO/fpw/8.26   BUN/Creatinine Ratio 7 (L) 9 - 23   Sodium 141 134 - 144 mmol/L   Potassium 3.6 3.5 - 5.2 mmol/L   Chloride 103 96 - 106 mmol/L   CO2 26 20 - 29 mmol/L   Calcium 9.3 8.7 - 10.2 mg/dL   Total Protein 7.2 6.0 - 8.5 g/dL   Albumin 4.2 4.0 - 5.0 g/dL   Globulin, Total 3.0 1.5 - 4.5 g/dL   Bilirubin Total 0.7 0.0 - 1.2 mg/dL   Alkaline Phosphatase 78 44 - 121 IU/L   AST 12 0 - 40 IU/L   ALT 13 0 - 32 IU/L  Lipid panel   Collection Time: 05/29/23  1:50 PM  Result Value Ref Range   Cholesterol, Total 138 100 - 199 mg/dL   Triglycerides 896 0 - 149 mg/dL   HDL 60 >60 mg/dL   VLDL Cholesterol Cal 19 5 - 40 mg/dL   LDL Chol Calc (NIH) 59 0 - 99 mg/dL   Chol/HDL Ratio 2.3 0.0 - 4.4 ratio  TSH   Collection Time: 05/29/23  1:50 PM  Result Value Ref Range   TSH 0.980 0.450 - 4.500 uIU/mL      Assessment & Plan:   Problem List Items Addressed This Visit   None    Follow up plan: No follow-ups on file.   LABORATORY TESTING:  - Pap smear: {Blank single:19197::pap done,not applicable,up to date,done elsewhere}  IMMUNIZATIONS:   - Tdap: Tetanus vaccination status reviewed: {tetanus status:315746}. - Influenza: {Blank single:19197::Up to date,Administered today,Postponed to flu season,Refused,Given elsewhere} - Pneumovax: {Blank single:19197::Up to date,Administered today,Not applicable,Refused,Given elsewhere} - Prevnar: {Blank single:19197::Up to date,Administered today,Not applicable,Refused,Given elsewhere} - COVID: {Blank single:19197::Up to  date,Administered today,Not applicable,Refused,Given elsewhere} - HPV: {Blank single:19197::Up to date,Administered today,Not applicable,Refused,Given elsewhere} - Shingrix vaccine: {Blank single:19197::Up to date,Administered today,Not applicable,Refused,Given elsewhere}  SCREENING: -Mammogram: {Blank single:19197::Up to date,Ordered today,Not applicable,Refused,Done elsewhere}  - Colonoscopy: {Blank single:19197::Up to date,Ordered today,Not applicable,Refused,Done elsewhere}  - Bone Density: {Blank single:19197::Up to date,Ordered today,Not applicable,Refused,Done elsewhere}  -Hearing Test: {Blank single:19197::Up to date,Ordered today,Not applicable,Refused,Done elsewhere}  -Spirometry: {Blank single:19197::Up to date,Ordered today,Not applicable,Refused,Done elsewhere}   PATIENT COUNSELING:   Advised to take 1 mg of folate supplement per day if capable of pregnancy.   Sexuality: Discussed sexually transmitted diseases, partner selection, use of condoms, avoidance of unintended pregnancy  and contraceptive alternatives.   Advised to avoid cigarette smoking.  I discussed with the patient that most people either abstain from alcohol or drink within safe limits (<=14/week and <=4 drinks/occasion for males, <=7/weeks and <= 3 drinks/occasion for females) and that the risk for alcohol disorders and other health effects rises proportionally with the number of drinks per week and how often a drinker exceeds daily limits.  Discussed cessation/primary prevention of drug use and availability of treatment for abuse.   Diet: Encouraged to adjust caloric intake to maintain  or achieve ideal body weight, to reduce intake of dietary saturated fat and total fat, to limit sodium intake by avoiding high sodium foods and not adding table salt, and to maintain adequate dietary potassium and calcium preferably from fresh fruits,  vegetables, and low-fat dairy products.    stressed the importance of regular exercise  Injury prevention: Discussed safety belts, safety helmets, smoke detector, smoking near bedding  or upholstery.   Dental health: Discussed importance of regular tooth brushing, flossing, and dental visits.    NEXT PREVENTATIVE PHYSICAL DUE IN 1 YEAR. No follow-ups on file.
